# Patient Record
Sex: Female | Born: 1990
Health system: Southern US, Community
[De-identification: ages and names within clinical notes are randomized; demographics above are authoritative.]

## PROBLEM LIST (undated history)

## (undated) DIAGNOSIS — A749 Chlamydial infection, unspecified: Secondary | ICD-10-CM

## (undated) DIAGNOSIS — J45909 Unspecified asthma, uncomplicated: Secondary | ICD-10-CM

## (undated) DIAGNOSIS — E669 Obesity, unspecified: Secondary | ICD-10-CM

## (undated) DIAGNOSIS — H02409 Unspecified ptosis of unspecified eyelid: Secondary | ICD-10-CM

## (undated) DIAGNOSIS — R51 Headache: Secondary | ICD-10-CM

## (undated) HISTORY — PX: INCISION AND DRAINAGE OF WOUND: SHX1803

## (undated) HISTORY — PX: APPENDECTOMY: SHX54

---
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
## 2011-02-05 ENCOUNTER — Emergency Department (HOSPITAL_COMMUNITY)
Admission: EM | Admit: 2011-02-05 | Discharge: 2011-02-06 | Disposition: A | Discharge status: Home / Self Care | Payer: Medicaid Other | Attending: Emergency Medicine | Admitting: Emergency Medicine

## 2011-02-05 DIAGNOSIS — R109 Unspecified abdominal pain: Secondary | ICD-10-CM | POA: Insufficient documentation

## 2011-02-05 DIAGNOSIS — R110 Nausea: Secondary | ICD-10-CM | POA: Insufficient documentation

## 2011-02-05 DIAGNOSIS — N390 Urinary tract infection, site not specified: Secondary | ICD-10-CM | POA: Insufficient documentation

## 2011-02-05 DIAGNOSIS — N898 Other specified noninflammatory disorders of vagina: Secondary | ICD-10-CM | POA: Insufficient documentation

## 2011-02-05 LAB — DIFFERENTIAL
Basophils Absolute: 0 10*3/uL (ref 0.0–0.1)
Eosinophils Absolute: 0.1 10*3/uL (ref 0.0–0.7)
Lymphocytes Relative: 17 % (ref 12–46)
Lymphs Abs: 1.5 10*3/uL (ref 0.7–4.0)
Monocytes Absolute: 0.9 10*3/uL (ref 0.1–1.0)
Monocytes Relative: 10 % (ref 3–12)
Neutrophils Relative %: 72 % (ref 43–77)

## 2011-02-05 LAB — URINALYSIS, ROUTINE W REFLEX MICROSCOPIC
Nitrite: NEGATIVE
pH: 6.5 (ref 5.0–8.0)

## 2011-02-05 LAB — CBC
HCT: 38.3 % (ref 36.0–46.0)
Hemoglobin: 12.9 g/dL (ref 12.0–15.0)
MCV: 77.2 fL — ABNORMAL LOW (ref 78.0–100.0)
Platelets: 234 10*3/uL (ref 150–400)
RBC: 4.96 MIL/uL (ref 3.87–5.11)
WBC: 8.9 10*3/uL (ref 4.0–10.5)

## 2011-02-05 LAB — BASIC METABOLIC PANEL
BUN: 10 mg/dL (ref 6–23)
CO2: 27 mEq/L (ref 19–32)
Calcium: 9.5 mg/dL (ref 8.4–10.5)
Creatinine, Ser: 0.68 mg/dL (ref 0.50–1.10)
Glucose, Bld: 93 mg/dL (ref 70–99)
Sodium: 134 mEq/L — ABNORMAL LOW (ref 135–145)

## 2011-02-05 LAB — URINE MICROSCOPIC-ADD ON

## 2011-02-06 LAB — WET PREP, GENITAL
Trich, Wet Prep: NONE SEEN
Yeast Wet Prep HPF POC: NONE SEEN

## 2011-02-09 LAB — GC/CHLAMYDIA PROBE AMP, GENITAL
Chlamydia, DNA Probe: POSITIVE — AB
GC Probe Amp, Genital: NEGATIVE

## 2011-09-02 ENCOUNTER — Emergency Department (HOSPITAL_COMMUNITY)
Admission: EM | Admit: 2011-09-02 | Discharge: 2011-09-02 | Discharge status: Absent without leave | Payer: Medicaid Other | Source: Home / Self Care

## 2011-12-19 ENCOUNTER — Encounter (HOSPITAL_COMMUNITY): Payer: Self-pay | Admitting: *Deleted

## 2011-12-19 ENCOUNTER — Emergency Department (HOSPITAL_COMMUNITY)
Admission: EM | Admit: 2011-12-19 | Discharge: 2011-12-19 | Disposition: A | Discharge status: Home / Self Care | Payer: Medicaid Other | Attending: Emergency Medicine | Admitting: Emergency Medicine

## 2011-12-19 DIAGNOSIS — H6690 Otitis media, unspecified, unspecified ear: Secondary | ICD-10-CM | POA: Insufficient documentation

## 2011-12-19 MED ORDER — OFLOXACIN 0.3 % OT SOLN
5.0000 [drp] | Freq: Every day | OTIC | Status: DC
  Filled 2011-12-19: qty 5

## 2011-12-19 MED ORDER — HYDROCODONE-ACETAMINOPHEN 5-325 MG PO TABS: ORAL_TABLET | ORAL | Status: AC

## 2011-12-19 MED ORDER — CEFDINIR 300 MG PO CAPS: 300.0000 mg | ORAL_CAPSULE | Freq: Two times a day (BID) | ORAL | Status: AC

## 2011-12-19 MED ORDER — IBUPROFEN 800 MG PO TABS
800.0000 mg | ORAL_TABLET | Freq: Once | ORAL | Status: AC
  Administered 2011-12-19: 800 mg via ORAL
  Filled 2011-12-19: qty 1

## 2011-12-19 NOTE — ED Notes (Signed)
Reports being diagnosed with left ear infection 4 days ago. Having bleeding from ear, headache, chills.

## 2011-12-19 NOTE — ED Provider Notes (Signed)
History     CSN: [id number]  Arrival date & time 12/19/11  0901   First MD Initiated Contact with Patient 12/19/11 [phone]      Chief Complaint  Patient presents with  . Headache  . Otalgia    (Consider location/radiation/quality/duration/timing/severity/associated sxs/prior treatment) HPI Comments: Patient with history of chronic ear infections, approximately 4 per year, presents today with a left ear infection for the past one week. Patient states she was seen for this and started on Augmentin approximately 4 days ago. Patient continues to take this however she continues to have drainage from the ear that is clear as well as bloody. Patient states that she's never seen an ENT doctor. Patient has a headache. She has had difficulty with hearing out of left ear for 'a long time' but it is worse with this infection. This morning she woke up with chills and shaking but she denies fever. Multiple previous courses of amox/augmentin. Nothing makes symptoms better or worse. Onset was acute. Course is constant.   Patient is a 20 y.o. female presenting with ear pain. The history is provided by the patient.  Otalgia This is a recurrent problem. The current episode started more than 1 week ago. There is pain in the left ear. The problem occurs constantly. The problem has not changed since onset.There has been no fever. The pain is mild. Associated symptoms include ear discharge and headaches. Pertinent negatives include no rhinorrhea, no sore throat, no abdominal pain, no diarrhea, no vomiting, no neck pain, no cough and no rash. Her past medical history is significant for chronic ear infection.    History reviewed. No pertinent past medical history.  History reviewed. No pertinent past surgical history.  History reviewed. No pertinent family history.  History  Substance Use Topics  . Smoking status: Not on file  . Smokeless tobacco: Not on file  . Alcohol Use: No    OB History    Grav Para  Term Preterm Abortions TAB SAB Ect Mult Living                  Review of Systems  Constitutional: Negative for fever.  HENT: Positive for ear pain and ear discharge. Negative for sore throat, rhinorrhea and neck pain.   Eyes: Negative for redness.  Respiratory: Negative for cough.   Cardiovascular: Negative for chest pain.  Gastrointestinal: Negative for nausea, vomiting, abdominal pain and diarrhea.  Genitourinary: Negative for dysuria.  Musculoskeletal: Negative for myalgias.  Skin: Negative for rash.  Neurological: Positive for headaches.    Allergies  Review of patient's allergies indicates no known allergies.  Home Medications   Current Outpatient Rx  Name Route Sig Dispense Refill  . AMOXICILLIN-POT CLAVULANATE 875-125 MG PO TABS Oral Take 1 tablet by mouth 2 (two) times daily.      BP 127/74  Pulse 74  Temp 98.6 F (37 C) (Oral)  Resp 16  SpO2 100%  Physical Exam  Nursing note and vitals reviewed. Constitutional: She appears well-developed and well-nourished.  HENT:  Head: Normocephalic and atraumatic.  Right Ear: Ear canal normal. No drainage. No mastoid tenderness. Tympanic membrane is not perforated and not erythematous. No decreased hearing is noted.  Left Ear: There is drainage. No swelling. No mastoid tenderness. Tympanic membrane is perforated and erythematous. Decreased hearing is noted.  Nose: Nose normal.  Mouth/Throat: Uvula is midline, oropharynx is clear and moist and mucous membranes are normal. Mucous membranes are not dry.  Eyes: Conjunctivae are normal. Right eye  exhibits no discharge. Left eye exhibits no discharge.  Neck: Normal range of motion. Neck supple.  Cardiovascular: Normal rate, regular rhythm and normal heart sounds.   Pulmonary/Chest: Effort normal and breath sounds normal. No respiratory distress.  Abdominal: Soft. There is no tenderness.  Neurological: She is alert.  Skin: Skin is warm and dry.  Psychiatric: She has a normal  mood and affect.    ED Course  Procedures (including critical care time)  Labs Reviewed - No data to display No results found.   1. Acute otitis media with perforation     9:51 AM Patient seen and examined. Medications ordered. Will irrigate ear.   Vital signs reviewed and are as follows: Filed Vitals:   12/19/11 0906  BP: 127/74  Pulse: 74  Temp: 98.6 F (37 C)  Resp: 16   Ear irrigated by nurse. On-exam, there is irritation of canal, perforation of ear drum, possibly chronic. Will treat. Stressed need for ENT follow-up. Patient admits she has had much trouble with classes and missing classes in college due to frequent infections and hearing difficulties.   Urged to return with worsening pain, fever, redness or swelling of/around ear. Patient verbalizes understanding and agrees.    MDM  Patient with frequent otitis -- now with increasing pain, drainage. Exam shows perforation and decreased hearing. Otofloxacin given in ED due to perforation. Given frequent use of amox/augmentin -- will switch antibiotics to omnicef. ENT referral given and family urged to follow-up given severity and duration of symptoms.         [place], [place] 12/19/11 [phone]

## 2011-12-19 NOTE — ED Notes (Signed)
MD at bedside. 

## 2011-12-19 NOTE — ED Provider Notes (Signed)
Medical screening examination/treatment/procedure(s) were performed by non-physician practitioner and as supervising physician I was immediately available for consultation/collaboration.  Makenize Messman L Mattison Stuckey, MD 12/19/11 1646 

## 2012-01-04 DIAGNOSIS — H02409 Unspecified ptosis of unspecified eyelid: Secondary | ICD-10-CM

## 2012-01-04 HISTORY — DX: Unspecified ptosis of unspecified eyelid: H02.409

## 2012-01-08 ENCOUNTER — Encounter (HOSPITAL_COMMUNITY): Payer: Self-pay | Admitting: Emergency Medicine

## 2012-01-08 ENCOUNTER — Encounter (HOSPITAL_COMMUNITY): Admission: EM | Disposition: A | Discharge status: Home / Self Care | Payer: Self-pay | Source: Home / Self Care

## 2012-01-08 ENCOUNTER — Inpatient Hospital Stay (HOSPITAL_COMMUNITY): Payer: Medicaid Other | Admitting: Anesthesiology

## 2012-01-08 ENCOUNTER — Encounter (HOSPITAL_COMMUNITY): Payer: Self-pay | Admitting: Anesthesiology

## 2012-01-08 ENCOUNTER — Emergency Department (HOSPITAL_COMMUNITY): Payer: Medicaid Other

## 2012-01-08 ENCOUNTER — Inpatient Hospital Stay (HOSPITAL_COMMUNITY)
Admission: EM | Admit: 2012-01-08 | Discharge: 2012-01-09 | DRG: 343 | Disposition: A | Discharge status: Home / Self Care | Payer: Medicaid Other | Attending: General Surgery | Admitting: General Surgery

## 2012-01-08 DIAGNOSIS — K3580 Unspecified acute appendicitis: Secondary | ICD-10-CM

## 2012-01-08 DIAGNOSIS — J45909 Unspecified asthma, uncomplicated: Secondary | ICD-10-CM | POA: Diagnosis present

## 2012-01-08 DIAGNOSIS — K37 Unspecified appendicitis: Principal | ICD-10-CM | POA: Diagnosis present

## 2012-01-08 DIAGNOSIS — R51 Headache: Secondary | ICD-10-CM

## 2012-01-08 HISTORY — DX: Headache: R51

## 2012-01-08 HISTORY — PX: LAPAROSCOPIC APPENDECTOMY: SHX408

## 2012-01-08 HISTORY — PX: APPENDECTOMY: SHX54

## 2012-01-08 HISTORY — DX: Unspecified asthma, uncomplicated: J45.909

## 2012-01-08 HISTORY — DX: Unspecified ptosis of unspecified eyelid: H02.409

## 2012-01-08 LAB — URINALYSIS, ROUTINE W REFLEX MICROSCOPIC
Protein, ur: NEGATIVE mg/dL
Specific Gravity, Urine: 1.021 (ref 1.005–1.030)

## 2012-01-08 LAB — BASIC METABOLIC PANEL
Chloride: 106 mEq/L (ref 96–112)
Glucose, Bld: 97 mg/dL (ref 70–99)
Sodium: 142 mEq/L (ref 135–145)

## 2012-01-08 LAB — CBC WITH DIFFERENTIAL/PLATELET
Basophils Absolute: 0 10*3/uL (ref 0.0–0.1)
Basophils Relative: 0 % (ref 0–1)
Eosinophils Absolute: 0.1 10*3/uL (ref 0.0–0.7)
HCT: 37.5 % (ref 36.0–46.0)
Lymphs Abs: 3 10*3/uL (ref 0.7–4.0)
MCHC: 33.1 g/dL (ref 30.0–36.0)
MCV: 76.8 fL — ABNORMAL LOW (ref 78.0–100.0)
Neutro Abs: 8.9 10*3/uL — ABNORMAL HIGH (ref 1.7–7.7)
Neutrophils Relative %: 70 % (ref 43–77)
RBC: 4.88 MIL/uL (ref 3.87–5.11)
WBC: 12.8 10*3/uL — ABNORMAL HIGH (ref 4.0–10.5)

## 2012-01-08 LAB — HEPATIC FUNCTION PANEL
AST: 23 U/L (ref 0–37)
Albumin: 4 g/dL (ref 3.5–5.2)
Alkaline Phosphatase: 86 U/L (ref 39–117)
Indirect Bilirubin: 0.2 mg/dL — ABNORMAL LOW (ref 0.3–0.9)
Total Bilirubin: 0.3 mg/dL (ref 0.3–1.2)
Total Protein: 7.5 g/dL (ref 6.0–8.3)

## 2012-01-08 LAB — URINE MICROSCOPIC-ADD ON

## 2012-01-08 LAB — POCT PREGNANCY, URINE: Preg Test, Ur: NEGATIVE

## 2012-01-08 SURGERY — APPENDECTOMY, LAPAROSCOPIC
Anesthesia: General | Site: Abdomen | Wound class: Contaminated

## 2012-01-08 MED ORDER — BUPIVACAINE-EPINEPHRINE PF 0.25-1:200000 % IJ SOLN
INTRAMUSCULAR | Status: AC
  Filled 2012-01-08: qty 30

## 2012-01-08 MED ORDER — MEPERIDINE HCL 25 MG/ML IJ SOLN: 6.2500 mg | INTRAMUSCULAR | Status: DC | PRN

## 2012-01-08 MED ORDER — HYDROMORPHONE HCL PF 1 MG/ML IJ SOLN
INTRAMUSCULAR | Status: AC
  Filled 2012-01-08: qty 1

## 2012-01-08 MED ORDER — NEOSTIGMINE METHYLSULFATE 1 MG/ML IJ SOLN
INTRAMUSCULAR | Status: DC | PRN
  Administered 2012-01-08: 4 mg via INTRAVENOUS
  Administered 2012-01-08: 1 mg via INTRAVENOUS

## 2012-01-08 MED ORDER — LACTATED RINGERS IV SOLN
INTRAVENOUS | Status: DC | PRN
  Administered 2012-01-08 (×2): via INTRAVENOUS

## 2012-01-08 MED ORDER — 0.9 % SODIUM CHLORIDE (POUR BTL) OPTIME
TOPICAL | Status: DC | PRN
  Administered 2012-01-08: 1000 mL

## 2012-01-08 MED ORDER — SODIUM CHLORIDE 0.9 % IR SOLN
Status: DC | PRN
  Administered 2012-01-08: 1000 mL

## 2012-01-08 MED ORDER — FENTANYL CITRATE 0.05 MG/ML IJ SOLN
INTRAMUSCULAR | Status: DC | PRN
  Administered 2012-01-08: 50 ug via INTRAVENOUS
  Administered 2012-01-08 (×3): 100 ug via INTRAVENOUS

## 2012-01-08 MED ORDER — SUCCINYLCHOLINE CHLORIDE 20 MG/ML IJ SOLN
INTRAMUSCULAR | Status: DC | PRN
  Administered 2012-01-08: 120 mg via INTRAVENOUS

## 2012-01-08 MED ORDER — MIDAZOLAM HCL 5 MG/5ML IJ SOLN
INTRAMUSCULAR | Status: DC | PRN
  Administered 2012-01-08 (×2): 1 mg via INTRAVENOUS

## 2012-01-08 MED ORDER — MORPHINE SULFATE 4 MG/ML IJ SOLN: 4.0000 mg | INTRAMUSCULAR | Status: DC | PRN

## 2012-01-08 MED ORDER — DIPHENHYDRAMINE HCL 12.5 MG/5ML PO ELIX
12.5000 mg | ORAL_SOLUTION | Freq: Four times a day (QID) | ORAL | Status: DC | PRN
  Filled 2012-01-08: qty 10

## 2012-01-08 MED ORDER — HYDROCODONE-ACETAMINOPHEN 5-325 MG PO TABS
1.0000 | ORAL_TABLET | ORAL | Status: DC | PRN
  Administered 2012-01-09: 2 via ORAL
  Filled 2012-01-08: qty 2

## 2012-01-08 MED ORDER — PIPERACILLIN-TAZOBACTAM 3.375 G IVPB
3.3750 g | Freq: Once | INTRAVENOUS | Status: AC
  Administered 2012-01-08: 3.375 g via INTRAVENOUS
  Filled 2012-01-08: qty 50

## 2012-01-08 MED ORDER — ACETAMINOPHEN 650 MG RE SUPP: 650.0000 mg | Freq: Four times a day (QID) | RECTAL | Status: DC | PRN

## 2012-01-08 MED ORDER — DIPHENHYDRAMINE HCL 50 MG/ML IJ SOLN
12.5000 mg | Freq: Four times a day (QID) | INTRAMUSCULAR | Status: DC | PRN
  Administered 2012-01-08 – 2012-01-09 (×2): 12.5 mg via INTRAVENOUS
  Filled 2012-01-08 (×2): qty 1

## 2012-01-08 MED ORDER — PROMETHAZINE HCL 25 MG/ML IJ SOLN
6.2500 mg | INTRAMUSCULAR | Status: DC | PRN
  Filled 2012-01-08: qty 1

## 2012-01-08 MED ORDER — HYDROMORPHONE HCL PF 1 MG/ML IJ SOLN
1.0000 mg | INTRAMUSCULAR | Status: DC | PRN
  Administered 2012-01-08 – 2012-01-09 (×2): 1 mg via INTRAVENOUS
  Filled 2012-01-08 (×2): qty 1

## 2012-01-08 MED ORDER — ROCURONIUM BROMIDE 100 MG/10ML IV SOLN
INTRAVENOUS | Status: DC | PRN
  Administered 2012-01-08: 20 mg via INTRAVENOUS

## 2012-01-08 MED ORDER — WHITE PETROLATUM GEL
Status: AC
  Filled 2012-01-08: qty 5

## 2012-01-08 MED ORDER — ACETAMINOPHEN 325 MG PO TABS: 650.0000 mg | ORAL_TABLET | Freq: Four times a day (QID) | ORAL | Status: DC | PRN

## 2012-01-08 MED ORDER — ONDANSETRON HCL 4 MG/2ML IJ SOLN: 4.0000 mg | Freq: Four times a day (QID) | INTRAMUSCULAR | Status: DC | PRN

## 2012-01-08 MED ORDER — LIDOCAINE HCL (CARDIAC) 20 MG/ML IV SOLN
INTRAVENOUS | Status: DC | PRN
  Administered 2012-01-08: 80 mg via INTRAVENOUS

## 2012-01-08 MED ORDER — GLYCOPYRROLATE 0.2 MG/ML IJ SOLN
INTRAMUSCULAR | Status: DC | PRN
  Administered 2012-01-08: .1 mg via INTRAVENOUS
  Administered 2012-01-08: .7 mg via INTRAVENOUS

## 2012-01-08 MED ORDER — SODIUM CHLORIDE 0.9 % IV SOLN
INTRAVENOUS | Status: DC
  Administered 2012-01-08 – 2012-01-09 (×2): via INTRAVENOUS

## 2012-01-08 MED ORDER — IOHEXOL 300 MG/ML  SOLN
100.0000 mL | Freq: Once | INTRAMUSCULAR | Status: AC | PRN
  Administered 2012-01-08: 100 mL via INTRAVENOUS

## 2012-01-08 MED ORDER — BUPIVACAINE-EPINEPHRINE 0.25% -1:200000 IJ SOLN
INTRAMUSCULAR | Status: DC | PRN
  Administered 2012-01-08: 26 mL

## 2012-01-08 MED ORDER — HYDROMORPHONE HCL PF 1 MG/ML IJ SOLN
0.2500 mg | INTRAMUSCULAR | Status: DC | PRN
  Administered 2012-01-08 (×4): 0.5 mg via INTRAVENOUS

## 2012-01-08 MED ORDER — DOCUSATE SODIUM 100 MG PO CAPS
100.0000 mg | ORAL_CAPSULE | Freq: Two times a day (BID) | ORAL | Status: DC
  Administered 2012-01-08 – 2012-01-09 (×2): 100 mg via ORAL
  Filled 2012-01-08 (×2): qty 1

## 2012-01-08 MED ORDER — LACTATED RINGERS IV SOLN
INTRAVENOUS | Status: DC
  Administered 2012-01-08: 11:00:00 via INTRAVENOUS

## 2012-01-08 MED ORDER — HYDROCODONE-ACETAMINOPHEN 5-325 MG PO TABS
1.0000 | ORAL_TABLET | ORAL | Status: DC | PRN
  Administered 2012-01-09 (×2): 2 via ORAL
  Filled 2012-01-08 (×2): qty 2

## 2012-01-08 MED ORDER — PANTOPRAZOLE SODIUM 40 MG IV SOLR
40.0000 mg | Freq: Every day | INTRAVENOUS | Status: DC
  Administered 2012-01-08: 40 mg via INTRAVENOUS
  Filled 2012-01-08 (×2): qty 40

## 2012-01-08 MED ORDER — PROPOFOL 10 MG/ML IV EMUL
INTRAVENOUS | Status: DC | PRN
  Administered 2012-01-08: 170 mg via INTRAVENOUS

## 2012-01-08 MED ORDER — IOHEXOL 300 MG/ML  SOLN
20.0000 mL | INTRAMUSCULAR | Status: DC
  Administered 2012-01-08: 20 mL via ORAL

## 2012-01-08 MED ORDER — ONDANSETRON HCL 4 MG/2ML IJ SOLN
INTRAMUSCULAR | Status: DC | PRN
  Administered 2012-01-08: 4 mg via INTRAVENOUS

## 2012-01-08 SURGICAL SUPPLY — 45 items
APPLIER CLIP ROT 10 11.4 M/L (STAPLE)
BLADE SURG ROTATE 9660 (MISCELLANEOUS) IMPLANT
CANISTER SUCTION 2500CC (MISCELLANEOUS) ×2 IMPLANT
CHLORAPREP W/TINT 26ML (MISCELLANEOUS) ×2 IMPLANT
CLIP APPLIE ROT 10 11.4 M/L (STAPLE) IMPLANT
CLOTH BEACON ORANGE TIMEOUT ST (SAFETY) ×2 IMPLANT
COVER SURGICAL LIGHT HANDLE (MISCELLANEOUS) ×2 IMPLANT
CUTTER FLEX LINEAR 45M (STAPLE) ×2 IMPLANT
DECANTER SPIKE VIAL GLASS SM (MISCELLANEOUS) IMPLANT
DERMABOND ADHESIVE PROPEN (GAUZE/BANDAGES/DRESSINGS) ×1
DERMABOND ADVANCED (GAUZE/BANDAGES/DRESSINGS) ×1
DERMABOND ADVANCED .7 DNX12 (GAUZE/BANDAGES/DRESSINGS) ×1 IMPLANT
DERMABOND ADVANCED .7 DNX6 (GAUZE/BANDAGES/DRESSINGS) ×1 IMPLANT
DRAPE UTILITY 15X26 W/TAPE STR (DRAPE) ×4 IMPLANT
ELECT REM PT RETURN 9FT ADLT (ELECTROSURGICAL) ×2
ELECTRODE REM PT RTRN 9FT ADLT (ELECTROSURGICAL) ×1 IMPLANT
ENDOLOOP SUT PDS II  0 18 (SUTURE)
ENDOLOOP SUT PDS II 0 18 (SUTURE) IMPLANT
GLOVE BIO SURGEON STRL SZ7.5 (GLOVE) ×2 IMPLANT
GLOVE BIOGEL PI IND STRL 6 (GLOVE) ×2 IMPLANT
GLOVE BIOGEL PI IND STRL 7.0 (GLOVE) ×2 IMPLANT
GLOVE BIOGEL PI INDICATOR 6 (GLOVE) ×2
GLOVE BIOGEL PI INDICATOR 7.0 (GLOVE) ×2
GLOVE ECLIPSE 6.5 STRL STRAW (GLOVE) ×2 IMPLANT
GLOVE SURG SS PI 7.0 STRL IVOR (GLOVE) ×2 IMPLANT
GOWN STRL NON-REIN LRG LVL3 (GOWN DISPOSABLE) ×6 IMPLANT
KIT BASIN OR (CUSTOM PROCEDURE TRAY) ×2 IMPLANT
KIT ROOM TURNOVER OR (KITS) ×2 IMPLANT
NS IRRIG 1000ML POUR BTL (IV SOLUTION) ×2 IMPLANT
PAD ARMBOARD 7.5X6 YLW CONV (MISCELLANEOUS) ×4 IMPLANT
POUCH SPECIMEN RETRIEVAL 10MM (ENDOMECHANICALS) ×2 IMPLANT
RELOAD STAPLE TA45 3.5 REG BLU (ENDOMECHANICALS) ×2 IMPLANT
SCALPEL HARMONIC ACE (MISCELLANEOUS) ×2 IMPLANT
SET IRRIG TUBING LAPAROSCOPIC (IRRIGATION / IRRIGATOR) ×2 IMPLANT
SLEEVE ENDOPATH XCEL 5M (ENDOMECHANICALS) ×2 IMPLANT
SPECIMEN JAR SMALL (MISCELLANEOUS) ×2 IMPLANT
SUT MNCRL AB 4-0 PS2 18 (SUTURE) ×2 IMPLANT
TOWEL OR 17X24 6PK STRL BLUE (TOWEL DISPOSABLE) ×2 IMPLANT
TOWEL OR 17X26 10 PK STRL BLUE (TOWEL DISPOSABLE) ×2 IMPLANT
TRAY FOLEY CATH 14FR (SET/KITS/TRAYS/PACK) ×2 IMPLANT
TRAY LAPAROSCOPIC (CUSTOM PROCEDURE TRAY) ×2 IMPLANT
TROCAR XCEL BLUNT TIP 100MML (ENDOMECHANICALS) ×2 IMPLANT
TROCAR XCEL NON-BLD 11X100MML (ENDOMECHANICALS) ×2 IMPLANT
TROCAR XCEL NON-BLD 5MMX100MML (ENDOMECHANICALS) ×2 IMPLANT
WATER STERILE IRR 1000ML POUR (IV SOLUTION) IMPLANT

## 2012-01-08 NOTE — Anesthesia Postprocedure Evaluation (Signed)
  Anesthesia Post-op Note  Patient: Karen Soto  Procedure(s) Performed: Procedure(s) (LRB): APPENDECTOMY LAPAROSCOPIC (N/A)  Patient Location: PACU  Anesthesia Type: General  Level of Consciousness: awake  Airway and Oxygen Therapy: Patient Spontanous Breathing  Post-op Pain: mild  Post-op Assessment: Post-op Vital signs reviewed  Post-op Vital Signs: stable  Complications: No apparent anesthesia complications

## 2012-01-08 NOTE — ED Notes (Signed)
PT. REPORTS RIGHT ABDOMINAL PAIN WITH NAUSEA ONSET 6 PM LAST NIGHT , DENIES VOMITTING OR DIARRHEA , NO FEVER OR CHILLS , DENIES DYSURIA OR VAGINAL DISCHARGE.

## 2012-01-08 NOTE — ED Notes (Signed)
OR IS READY FOR PT. REPORT CALLED TO [id number] . REPORT TO TOM

## 2012-01-08 NOTE — Op Note (Signed)
01/08/2012  1:30 PM  PATIENT:  Karen Soto  21 y.o. female  PRE-OPERATIVE DIAGNOSIS:  Acute Appendicitis  POST-OPERATIVE DIAGNOSIS:  Acute Appendicitis  PROCEDURE:  Procedure(s) (LRB): APPENDECTOMY LAPAROSCOPIC (N/A)  SURGEON:  Surgeon(s) and Role:    * Robyne Askew, MD - Primary  PHYSICIAN ASSISTANT:   ASSISTANTS: none   ANESTHESIA:   general  EBL:     BLOOD ADMINISTERED:none  DRAINS: none   LOCAL MEDICATIONS USED:  MARCAINE     SPECIMEN:  Source of Specimen:  appendix  DISPOSITION OF SPECIMEN:  PATHOLOGY  COUNTS:  YES  TOURNIQUET:  * No tourniquets in log *  DICTATION: .Dragon Dictation After informed consent was obtained patient was brought to the operating room placed in the supine position on the operating room table. After adequate induction of general anesthesia the patient's abdomen was prepped with ChloraPrep, allowed to dry, and draped in usual sterile manner. The area below the umbilicus was infiltrated with quarter percent Marcaine. A small incision was made with a 15 blade knife. This incision was carried down through the subcutaneous tissue bluntly with a hemostat and [employer]-Nav[employer] retractors until the linea alba was identified. The linea alba was incised with a 15 blade knife. Each side was grasped Coker clamps and elevated anteriorly. The preperitoneal space was probed bluntly with a hemostat until the peritoneum was opened and access was gained to the abdominal cavity. A 0 Vicryl purse string stitch was placed in the fascia surrounding the opening. A Hassan cannula was placed through the opening and anchored in place with the previously placed Vicryl purse string stitch. The laparoscope was placed through the [facility] cannula. The abdomen was insufflated with carbon dioxide without difficulty. nnext the suprapubic area was infiltrated with quarter percent Marcaine. A small incision was made with a 15 blade knife. A 5 mm port was placed bluntly through this  incision into the abdominal cavity. A site was then chosen between the 2 port for placement of a 5 mm port. The area was infiltrated with quarter percent Marcaine. A small stab incision was made with a 15 blade knife. A 5 mm port was placed bluntly through this incision and the abdominal cavity under direct vision. The laparoscope was then moved to the suprapubic port. Using a Glassman grasper and harmonic scalpel the right lower quadrant was inspected. The appendix was readily identified. The appendix was elevated anteriorly and the mesoappendix was taken down sharply with the harmonic scalpel. Once the base of the appendix where it joined the cecum was identified and cleared of any tissue then a laparoscopic GIA blue load 6 row stapler was placed through the [facility] cannula. The stapler was placed across the base of the appendix clamped and fired thereby dividing the base of the appendix between staple lines. A laparoscopic bag was then inserted through the [facility] cannula. The appendix was placed within the bag and the bag was sealed. The abdomen was then irrigated with copious amounts of saline until the effluent was clear. No other abnormalities were noted. The appendix and bag were removed with the [facility] cannula through the infraumbilical port without difficulty. The fascial defect was closed with the previously placed Vicryl pursestring stitch as well as with another interrupted 0 Vicryl figure-of-eight stitch. The rest of the ports were removed under direct vision and were found to be hemostatic. The gas was allowed to escape. The skin incisions were closed with interrupted 4-0 Monocryl subcuticular stitches. Dermabond dressings were applied. The patient tolerated  the procedure well. At the end of the case all needle sponge and instrument counts were correct. The patient was then awakened and taken to recovery in stable condition.   PLAN OF CARE: Admit for overnight observation  PATIENT DISPOSITION:  PACU  - hemodynamically stable.   Delay start of Pharmacological VTE agent (>24hrs) due to surgical blood loss or risk of bleeding: not applicable

## 2012-01-08 NOTE — Preoperative (Signed)
Beta Blockers 2  Reason not to administer Beta Blockers:Not Applicable 

## 2012-01-08 NOTE — ED Provider Notes (Addendum)
History     CSN: [id number]  Arrival date & time 01/08/12  0458   First MD Initiated Contact with Patient 01/08/12 [phone]      Chief Complaint  Patient presents with  . Abdominal Pain    (Consider location/radiation/quality/duration/timing/severity/associated sxs/prior treatment) Patient is a 21 y.o. female presenting with abdominal pain. The history is provided by the patient.  Abdominal Pain The primary symptoms of the illness include abdominal pain. The primary symptoms of the illness do not include fever, shortness of breath or dysuria.  Symptoms associated with the illness do not include chills, hematuria or back pain.  pt c/o right abd pain for past 3 days. Constant. Dull. Non radiating. Denies specific exacerbating or alleviating factors. No change w eating. Feels in both right upper and right mid abdomen. No pelvic pain. No vaginal discharge or bleeding. Unsure of last period, has been on depo shots. Denies hx same pain. No hx gallstones. No prior abd surgery. No fever or chills. Sl decreased appetite. No nvd. Denies constipation. No dysuria or urgency.      Past Medical History  Diagnosis Date  . Asthma     History reviewed. No pertinent past surgical history.  No family history on file.  History  Substance Use Topics  . Smoking status: Not on file  . Smokeless tobacco: Not on file  . Alcohol Use: No    OB History    Grav Para Term Preterm Abortions TAB SAB Ect Mult Living                  Review of Systems  Constitutional: Negative for fever and chills.  HENT: Negative for neck pain.   Eyes: Negative for redness.  Respiratory: Negative for cough and shortness of breath.   Cardiovascular: Negative for chest pain.  Gastrointestinal: Positive for abdominal pain.  Genitourinary: Negative for dysuria, hematuria and flank pain.  Musculoskeletal: Negative for back pain.  Skin: Negative for rash.  Neurological: Negative for headaches.  Hematological: Does not  bruise/bleed easily.  Psychiatric/Behavioral: Negative for confusion.    Allergies  Review of patient's allergies indicates no known allergies.  Home Medications  No current outpatient prescriptions on file.  BP 129/87  Pulse 100  Temp 98.1 F (36.7 C) (Oral)  Resp 18  SpO2 100%  Physical Exam  Nursing note and vitals reviewed. Constitutional: She appears well-developed and well-nourished. No distress.  HENT:  Mouth/Throat: Oropharynx is clear and moist.  Eyes: Conjunctivae are normal. No scleral icterus.  Neck: Neck supple. No tracheal deviation present.  Cardiovascular: Normal rate, regular rhythm, normal heart sounds and intact distal pulses.   Pulmonary/Chest: Effort normal and breath sounds normal. No respiratory distress.  Abdominal: Soft. Normal appearance and bowel sounds are normal. She exhibits no distension and no mass. There is tenderness. There is no rebound and no guarding.       Right abdominal tenderness. No rebound or guarding.   Genitourinary:       No cva tenderness  Musculoskeletal: She exhibits no edema.  Neurological: She is alert.  Skin: Skin is warm and dry. No rash noted.  Psychiatric: She has a normal mood and affect.    ED Course  Procedures (including critical care time)   Labs Reviewed  URINALYSIS, ROUTINE W REFLEX MICROSCOPIC  CBC WITH DIFFERENTIAL  BASIC METABOLIC PANEL  HEPATIC FUNCTION PANEL  LIPASE, BLOOD    Results for orders placed during the hospital encounter of 01/08/12  URINALYSIS, ROUTINE W REFLEX MICROSCOPIC  Component Value Range   Color, Urine YELLOW  YELLOW   APPearance CLOUDY (*) CLEAR   Specific Gravity, Urine 1.021  1.005 - 1.030   pH 6.5  5.0 - 8.0   Glucose, UA NEGATIVE  NEGATIVE mg/dL   Hgb urine dipstick MODERATE (*) NEGATIVE   Bilirubin Urine NEGATIVE  NEGATIVE   Ketones, ur NEGATIVE  NEGATIVE mg/dL   Protein, ur NEGATIVE  NEGATIVE mg/dL   Urobilinogen, UA 0.2  0.0 - 1.0 mg/dL   Nitrite NEGATIVE   NEGATIVE   Leukocytes, UA TRACE (*) NEGATIVE  CBC WITH DIFFERENTIAL      Component Value Range   WBC 12.8 (*) 4.0 - 10.5 K/uL   RBC 4.88  3.87 - 5.11 MIL/uL   Hemoglobin 12.4  12.0 - 15.0 g/dL   HCT 13.2  44.0 - 10.2 %   MCV 76.8 (*) 78.0 - 100.0 fL   MCH 25.4 (*) 26.0 - 34.0 pg   MCHC 33.1  30.0 - 36.0 g/dL   RDW 72.5  36.6 - 44.0 %   Platelets 246  150 - 400 K/uL   Neutrophils Relative 70  43 - 77 %   Neutro Abs 8.9 (*) 1.7 - 7.7 K/uL   Lymphocytes Relative 23  12 - 46 %   Lymphs Abs 3.0  0.7 - 4.0 K/uL   Monocytes Relative 6  3 - 12 %   Monocytes Absolute 0.8  0.1 - 1.0 K/uL   Eosinophils Relative 1  0 - 5 %   Eosinophils Absolute 0.1  0.0 - 0.7 K/uL   Basophils Relative 0  0 - 1 %   Basophils Absolute 0.0  0.0 - 0.1 K/uL  BASIC METABOLIC PANEL      Component Value Range   Sodium 142  135 - 145 mEq/L   Potassium 3.8  3.5 - 5.1 mEq/L   Chloride 106  96 - 112 mEq/L   CO2 25  19 - 32 mEq/L   Glucose, Bld 97  70 - 99 mg/dL   BUN 11  6 - 23 mg/dL   Creatinine, Ser 3.47  0.50 - 1.10 mg/dL   Calcium 9.5  8.4 - 42.5 mg/dL   GFR calc non Af Amer >90  >90 mL/min   GFR calc Af Amer >90  >90 mL/min  HEPATIC FUNCTION PANEL      Component Value Range   Total Protein 7.5  6.0 - 8.3 g/dL   Albumin 4.0  3.5 - 5.2 g/dL   AST 23  0 - 37 U/L   ALT 28  0 - 35 U/L   Alkaline Phosphatase 86  39 - 117 U/L   Total Bilirubin 0.3  0.3 - 1.2 mg/dL   Bilirubin, Direct 0.1  0.0 - 0.3 mg/dL   Indirect Bilirubin 0.2 (*) 0.3 - 0.9 mg/dL  LIPASE, BLOOD      Component Value Range   Lipase 27  11 - 59 U/L  POCT PREGNANCY, URINE      Component Value Range   Preg Test, Ur NEGATIVE  NEGATIVE  URINE MICROSCOPIC-ADD ON      Component Value Range   Squamous Epithelial / LPF FEW (*) RARE   WBC, UA 3-6  <3 WBC/hpf   RBC / HPF 0-2  <3 RBC/hpf   Bacteria, UA RARE  RARE   Urine-Other MUCOUS PRESENT         MDM  Iv ns. Labs.   Ct.   Signed out to morning edp to  check ct scan when resulted, and  dispo appropriately based on those results.         Suzi Roots, MD 01/08/12 [phone]  CT confirms appendicitis - discussed with radiologist. [facility] is consulted for admission.  Results for orders placed during the hospital encounter of 01/08/12  URINALYSIS, ROUTINE W REFLEX MICROSCOPIC      Component Value Range   Color, Urine YELLOW  YELLOW   APPearance CLOUDY (*) CLEAR   Specific Gravity, Urine 1.021  1.005 - 1.030   pH 6.5  5.0 - 8.0   Glucose, UA NEGATIVE  NEGATIVE mg/dL   Hgb urine dipstick MODERATE (*) NEGATIVE   Bilirubin Urine NEGATIVE  NEGATIVE   Ketones, ur NEGATIVE  NEGATIVE mg/dL   Protein, ur NEGATIVE  NEGATIVE mg/dL   Urobilinogen, UA 0.2  0.0 - 1.0 mg/dL   Nitrite NEGATIVE  NEGATIVE   Leukocytes, UA TRACE (*) NEGATIVE  CBC WITH DIFFERENTIAL      Component Value Range   WBC 12.8 (*) 4.0 - 10.5 K/uL   RBC 4.88  3.87 - 5.11 MIL/uL   Hemoglobin 12.4  12.0 - 15.0 g/dL   HCT 96.0  45.4 - 09.8 %   MCV 76.8 (*) 78.0 - 100.0 fL   MCH 25.4 (*) 26.0 - 34.0 pg   MCHC 33.1  30.0 - 36.0 g/dL   RDW 11.9  14.7 - 82.9 %   Platelets 246  150 - 400 K/uL   Neutrophils Relative 70  43 - 77 %   Neutro Abs 8.9 (*) 1.7 - 7.7 K/uL   Lymphocytes Relative 23  12 - 46 %   Lymphs Abs 3.0  0.7 - 4.0 K/uL   Monocytes Relative 6  3 - 12 %   Monocytes Absolute 0.8  0.1 - 1.0 K/uL   Eosinophils Relative 1  0 - 5 %   Eosinophils Absolute 0.1  0.0 - 0.7 K/uL   Basophils Relative 0  0 - 1 %   Basophils Absolute 0.0  0.0 - 0.1 K/uL  BASIC METABOLIC PANEL      Component Value Range   Sodium 142  135 - 145 mEq/L   Potassium 3.8  3.5 - 5.1 mEq/L   Chloride 106  96 - 112 mEq/L   CO2 25  19 - 32 mEq/L   Glucose, Bld 97  70 - 99 mg/dL   BUN 11  6 - 23 mg/dL   Creatinine, Ser 5.62  0.50 - 1.10 mg/dL   Calcium 9.5  8.4 - 13.0 mg/dL   GFR calc non Af Amer >90  >90 mL/min   GFR calc Af Amer >90  >90 mL/min  HEPATIC FUNCTION PANEL      Component Value Range   Total  Protein 7.5  6.0 - 8.3 g/dL   Albumin 4.0  3.5 - 5.2 g/dL   AST 23  0 - 37 U/L   ALT 28  0 - 35 U/L   Alkaline Phosphatase 86  39 - 117 U/L   Total Bilirubin 0.3  0.3 - 1.2 mg/dL   Bilirubin, Direct 0.1  0.0 - 0.3 mg/dL   Indirect Bilirubin 0.2 (*) 0.3 - 0.9 mg/dL  LIPASE, BLOOD      Component Value Range   Lipase 27  11 - 59 U/L  POCT PREGNANCY, URINE      Component Value Range   Preg Test, Ur NEGATIVE  NEGATIVE  URINE MICROSCOPIC-ADD ON      Component  Value Range   Squamous Epithelial / LPF FEW (*) RARE   WBC, UA 3-6  <3 WBC/hpf   RBC / HPF 0-2  <3 RBC/hpf   Bacteria, UA RARE  RARE   Urine-Other MUCOUS PRESENT     Ct Abdomen Pelvis W Contrast  01/08/2012  *RADIOLOGY REPORT*  Clinical Data: Right mid to lower quadrant abdominal pain.  Nausea and vomiting.  CT ABDOMEN AND PELVIS WITH CONTRAST  Technique:  Multidetector CT imaging of the abdomen and pelvis was performed following the standard protocol during bolus administration of intravenous contrast.  Contrast: OMNIPAQUE IOHEXOL 300 MG/ML  SOLN  Comparison: None.  Findings: The liver, spleen, pancreas, and adrenal glands appear unremarkable.  The gallbladder and biliary system appear unremarkable.  The kidneys appear unremarkable, as do the proximal ureters.  Abnormal stranding in the right lower quadrant is present, with thickening of the appendix to 1.1 cm compatible with acute appendicitis.  No extraluminal gas or periappendiceal abscess observed.  However, there is a small amount of free pelvic fluid in the cul-de-sac.  Urinary bladder appears normal. The uterus and adnexa appear unremarkable.  No pathologic pelvic adenopathy is identified.  IMPRESSION:  1.  Acute appendicitis without extraluminal gas or abscess.  There is a small amount of fluid in the cul-de-sac.  Original Report Authenticated By: Dellia Cloud, M.D.      Dione Booze, MD 01/08/12 [phone]

## 2012-01-08 NOTE — Consult Note (Signed)
Risks and benefits of surgery discussed with pt including some of the technical aspects and she understands and wishes to proceed. 

## 2012-01-08 NOTE — ED Notes (Signed)
Family at bedside. 

## 2012-01-08 NOTE — ED Notes (Signed)
SPOKE WITH OR. THEY ADVISE WILL BE LESS THAN AN HOUR BEFORE THEY CALL FOR PT

## 2012-01-08 NOTE — Anesthesia Preprocedure Evaluation (Signed)
Anesthesia Evaluation  Patient identified by MRN, date of birth, ID band Patient awake    Airway Mallampati: I  Neck ROM: full    Dental   Pulmonary asthma ,  breath sounds clear to auscultation        Cardiovascular Rhythm:regular Rate:Normal     Neuro/Psych    GI/Hepatic Appendicictis   Endo/Other    Renal/GU      Musculoskeletal   Abdominal   Peds  Hematology negative hematology ROS (+)   Anesthesia Other Findings   Reproductive/Obstetrics                           Anesthesia Physical Anesthesia Plan  ASA: I and Emergent  Anesthesia Plan: General ETT   Post-op Pain Management:    Induction:   Airway Management Planned:   Additional Equipment:   Intra-op Plan:   Post-operative Plan:   Informed Consent: I have reviewed the patients History and Physical, chart, labs and discussed the procedure including the risks, benefits and alternatives for the proposed anesthesia with the patient or authorized representative who has indicated his/her understanding and acceptance.   Dental Advisory Given  Plan Discussed with: Anesthesiologist and Surgeon  Anesthesia Plan Comments:         Anesthesia Quick Evaluation

## 2012-01-08 NOTE — Consult Note (Signed)
Reason for Consult: Abdominal pain probable acute appendicitis  Referring Physician:David Lyndy Russman is an 21 y.o. female.  HPI: with c/o right abd pain for past 3 days. Constant. Dull. Non radiating. Denies specific exacerbating or alleviating factors. No change with eating. Feels in both RLQ and umbilical region . No pelvic pain. No vaginal discharge or bleeding. Unsure of last period, has been on depo shots. Denies hx same pain. No hx gallstones. No prior abd surgery. No fever or chills. Sl decreased appetite. No nvd. Denies constipation. No dysuria or urgency. Last BM was yesterday "normal" Last po intake was yesterday morning. CT of abdomen:  Acute appendicitis without extraluminal gas or abscess. There  is a small amount of fluid in the cul-de-sac.      Past Medical History  Diagnosis Date  . Asthma     History reviewed. No pertinent past surgical history.  No family history on file.  Social History:  does not have a smoking history on file. She does not have any smokeless tobacco history on file. She reports that she does not drink alcohol or use illicit drugs.  Allergies: No Known Allergies  Medications: I have reviewed the patient's current medications.  Results for orders placed during the hospital encounter of 01/08/12 (from the past 48 hour(s))  CBC WITH DIFFERENTIAL     Status: Abnormal   Collection Time   01/08/12  5:05 AM      Component Value Range Comment   WBC 12.8 (*) 4.0 - 10.5 K/uL    RBC 4.88  3.87 - 5.11 MIL/uL    Hemoglobin 12.4  12.0 - 15.0 g/dL    HCT 08.6  57.8 - 46.9 %    MCV 76.8 (*) 78.0 - 100.0 fL    MCH 25.4 (*) 26.0 - 34.0 pg    MCHC 33.1  30.0 - 36.0 g/dL    RDW 62.9  52.8 - 41.3 %    Platelets 246  150 - 400 K/uL    Neutrophils Relative 70  43 - 77 %    Neutro Abs 8.9 (*) 1.7 - 7.7 K/uL    Lymphocytes Relative 23  12 - 46 %    Lymphs Abs 3.0  0.7 - 4.0 K/uL    Monocytes Relative 6  3 - 12 %    Monocytes Absolute 0.8  0.1 -  1.0 K/uL    Eosinophils Relative 1  0 - 5 %    Eosinophils Absolute 0.1  0.0 - 0.7 K/uL    Basophils Relative 0  0 - 1 %    Basophils Absolute 0.0  0.0 - 0.1 K/uL   BASIC METABOLIC PANEL     Status: Normal   Collection Time   01/08/12  5:05 AM      Component Value Range Comment   Sodium 142  135 - 145 mEq/L    Potassium 3.8  3.5 - 5.1 mEq/L    Chloride 106  96 - 112 mEq/L    CO2 25  19 - 32 mEq/L    Glucose, Bld 97  70 - 99 mg/dL    BUN 11  6 - 23 mg/dL    Creatinine, Ser 2.44  0.50 - 1.10 mg/dL    Calcium 9.5  8.4 - 01.0 mg/dL    GFR calc non Af Amer >90  >90 mL/min    GFR calc Af Amer >90  >90 mL/min   HEPATIC FUNCTION PANEL     Status: Abnormal  Collection Time   01/08/12  5:29 AM      Component Value Range Comment   Total Protein 7.5  6.0 - 8.3 g/dL    Albumin 4.0  3.5 - 5.2 g/dL    AST 23  0 - 37 U/L    ALT 28  0 - 35 U/L    Alkaline Phosphatase 86  39 - 117 U/L    Total Bilirubin 0.3  0.3 - 1.2 mg/dL    Bilirubin, Direct 0.1  0.0 - 0.3 mg/dL    Indirect Bilirubin 0.2 (*) 0.3 - 0.9 mg/dL   LIPASE, BLOOD     Status: Normal   Collection Time   01/08/12  5:29 AM      Component Value Range Comment   Lipase 27  11 - 59 U/L   URINALYSIS, ROUTINE W REFLEX MICROSCOPIC     Status: Abnormal   Collection Time   01/08/12  5:51 AM      Component Value Range Comment   Color, Urine YELLOW  YELLOW    APPearance CLOUDY (*) CLEAR    Specific Gravity, Urine 1.021  1.005 - 1.030    pH 6.5  5.0 - 8.0    Glucose, UA NEGATIVE  NEGATIVE mg/dL    Hgb urine dipstick MODERATE (*) NEGATIVE    Bilirubin Urine NEGATIVE  NEGATIVE    Ketones, ur NEGATIVE  NEGATIVE mg/dL    Protein, ur NEGATIVE  NEGATIVE mg/dL    Urobilinogen, UA 0.2  0.0 - 1.0 mg/dL    Nitrite NEGATIVE  NEGATIVE    Leukocytes, UA TRACE (*) NEGATIVE   URINE MICROSCOPIC-ADD ON     Status: Abnormal   Collection Time   01/08/12  5:51 AM      Component Value Range Comment   Squamous Epithelial / LPF FEW (*) RARE    WBC, UA 3-6   <3 WBC/hpf    RBC / HPF 0-2  <3 RBC/hpf    Bacteria, UA RARE  RARE    Urine-Other MUCOUS PRESENT     POCT PREGNANCY, URINE     Status: Normal   Collection Time   01/08/12  5:55 AM      Component Value Range Comment   Preg Test, Ur NEGATIVE  NEGATIVE     Ct Abdomen Pelvis W Contrast  01/08/2012  *RADIOLOGY REPORT*  Clinical Data: Right mid to lower quadrant abdominal pain.  Nausea and vomiting.  CT ABDOMEN AND PELVIS WITH CONTRAST  Technique:  Multidetector CT imaging of the abdomen and pelvis was performed following the standard protocol during bolus administration of intravenous contrast.  Contrast: OMNIPAQUE IOHEXOL 300 MG/ML  SOLN  Comparison: None.  Findings: The liver, spleen, pancreas, and adrenal glands appear unremarkable.  The gallbladder and biliary system appear unremarkable.  The kidneys appear unremarkable, as do the proximal ureters.  Abnormal stranding in the right lower quadrant is present, with thickening of the appendix to 1.1 cm compatible with acute appendicitis.  No extraluminal gas or periappendiceal abscess observed.  However, there is a small amount of free pelvic fluid in the cul-de-sac.  Urinary bladder appears normal. The uterus and adnexa appear unremarkable.  No pathologic pelvic adenopathy is identified.  IMPRESSION:  1.  Acute appendicitis without extraluminal gas or abscess.  There is a small amount of fluid in the cul-de-sac.  Original Report Authenticated By: Dellia Cloud, M.D.    Review of Systems  Constitutional: Negative for fever, chills, weight loss, malaise/fatigue and diaphoresis.  HENT:  Negative.   Eyes: Negative.   Respiratory: Negative.   Cardiovascular: Negative.   Gastrointestinal: Positive for abdominal pain. Negative for heartburn, nausea, vomiting, diarrhea, constipation, blood in stool and melena.  Genitourinary: Negative.   Musculoskeletal: Negative.   Skin: Negative.   Neurological: Positive for weakness.  Endo/Heme/Allergies:  Negative.   Psychiatric/Behavioral: Negative.    Blood pressure 134/78, pulse 98, temperature 98 F (36.7 C), temperature source Oral, resp. rate 16, SpO2 100.00%. Physical Exam  Constitutional: She is oriented to person, place, and time. She appears well-developed and well-nourished. No distress.  HENT:  Head: Normocephalic and atraumatic.  Mouth/Throat: No oropharyngeal exudate.  Eyes: Conjunctivae and EOM are normal. Pupils are equal, round, and reactive to light. Right eye exhibits no discharge. Left eye exhibits no discharge. No scleral icterus.  Neck: Normal range of motion. Neck supple. No JVD present. No tracheal deviation present. No thyromegaly present.  Cardiovascular: Normal rate, regular rhythm, normal heart sounds and intact distal pulses.  Exam reveals no gallop and no friction rub.   No murmur heard. Respiratory: Effort normal and breath sounds normal. No stridor. No respiratory distress. She has no wheezes. She has no rales. She exhibits no tenderness.  GI: Soft. Bowel sounds are normal. She exhibits no distension and no mass. There is tenderness. There is rebound and guarding.    Musculoskeletal: Normal range of motion.  Lymphadenopathy:    She has no cervical adenopathy.  Neurological: She is alert and oriented to person, place, and time.  Skin: Skin is warm and dry. No rash noted. She is not diaphoretic. No erythema. No pallor.  Psychiatric: She has a normal mood and affect.    Assessment/Plan: Acute appendicitis  Admit to med surg floor NPO IVF ABX pre-op Laproscopic appendectomy   Mannie Ohlin 01/08/2012, 9:49 AM

## 2012-01-08 NOTE — ED Notes (Signed)
MD at bedside. Admitting MD at bedside

## 2012-01-08 NOTE — ED Notes (Signed)
Called CT to advise that pt has finished contrast

## 2012-01-08 NOTE — ED Notes (Signed)
Pt reports rt side abdominal pain since 6p.m. Pt rates pain at a 6 and states " It hurts when sit back and lay down". Pt denies vaginal discharge, vaginal pain while urinating.

## 2012-01-08 NOTE — Transfer of Care (Signed)
Immediate Anesthesia Transfer of Care Note  Patient: Karen Soto  Procedure(s) Performed: Procedure(s) (LRB): APPENDECTOMY LAPAROSCOPIC (N/A)  Patient Location: PACU  Anesthesia Type: General  Level of Consciousness: awake, alert  and oriented  Airway & Oxygen Therapy: Patient Spontanous Breathing and Patient connected to nasal cannula oxygen  Post-op Assessment: Report given to PACU RN and Post -op Vital signs reviewed and stable  Post vital signs: Reviewed and stable  Complications: No apparent anesthesia complications

## 2012-01-09 MED ORDER — HYDROCODONE-ACETAMINOPHEN 5-325 MG PO TABS: 1.0000 | ORAL_TABLET | ORAL | Status: AC | PRN

## 2012-01-09 NOTE — Progress Notes (Signed)
1 Day Post-Op  Subjective: Sleepy this morning still some appropriate abdominal tenderness, no Flatus, BM. No other c/o offered.  Objective: Vital signs in last 24 hours: Temp:  [97.5 F (36.4 C)-98.6 F (37 C)] 98.5 F (36.9 C) (08/17 0527) Pulse Rate:  [60-98] 74  (08/17 0527) Resp:  [15-20] 16  (08/17 0527) BP: (107-134)/(48-103) 110/60 mmHg (08/17 0527) SpO2:  [99 %-100 %] 100 % (08/17 0527) Weight:  [240 lb (108.863 kg)] 240 lb (108.863 kg) (08/16 1600) Last BM Date: 01/07/12  Intake/Output from previous day: 08/16 0701 - 08/17 0700 In: 2343 [P.O.:240; I.V.:2103] Out: 1810 [Urine:1800; Blood:10] Intake/Output this shift:    General appearance: cooperative, appears stated age and no distress, sleepy this am ? From pain meds. Chest: CTA bilaterally Abdomen: all surgical wounds are well approximated, abdomen is soft, appropriately tender, minimal BS, No flatus, BM, No bloating or N/V. VSS,afebrile, no labs this am.  Lab Results:   Basename 01/08/12 0505  WBC 12.8*  HGB 12.4  HCT 37.5  PLT 246   BMET  Basename 01/08/12 0505  NA 142  K 3.8  CL 106  CO2 25  GLUCOSE 97  BUN 11  CREATININE 0.78  CALCIUM 9.5   PT/INR No results found for this basename: LABPROT:2,INR:2 in the last 72 hours ABG No results found for this basename: PHART:2,PCO2:2,PO2:2,HCO3:2 in the last 72 hours  Studies/Results: Ct Abdomen Pelvis W Contrast  01/08/2012  *RADIOLOGY REPORT*  Clinical Data: Right mid to lower quadrant abdominal pain.  Nausea and vomiting.  CT ABDOMEN AND PELVIS WITH CONTRAST  Technique:  Multidetector CT imaging of the abdomen and pelvis was performed following the standard protocol during bolus administration of intravenous contrast.  Contrast: OMNIPAQUE IOHEXOL 300 MG/ML  SOLN  Comparison: None.  Findings: The liver, spleen, pancreas, and adrenal glands appear unremarkable.  The gallbladder and biliary system appear unremarkable.  The kidneys appear  unremarkable, as do the proximal ureters.  Abnormal stranding in the right lower quadrant is present, with thickening of the appendix to 1.1 cm compatible with acute appendicitis.  No extraluminal gas or periappendiceal abscess observed.  However, there is a small amount of free pelvic fluid in the cul-de-sac.  Urinary bladder appears normal. The uterus and adnexa appear unremarkable.  No pathologic pelvic adenopathy is identified.  IMPRESSION:  1.  Acute appendicitis without extraluminal gas or abscess.  There is a small amount of fluid in the cul-de-sac.  Original Report Authenticated By: Dellia Cloud, M.D.    Anti-infectives: Anti-infectives     Start     Dose/Rate Route Frequency Ordered Stop   01/08/12 1045   piperacillin-tazobactam (ZOSYN) IVPB 3.375 g        3.375 g 12.5 mL/hr over 240 Minutes Intravenous  Once 01/08/12 1036 01/08/12 1116          Assessment/Plan: s/p Procedure(s) (LRB): APPENDECTOMY LAPAROSCOPIC (N/A) 1. Encourage ambulation 2. Continue with clears for now 3. Possible discharge home later today.    LOS: 1 day    Lahari Suttles 01/09/2012

## 2012-01-09 NOTE — Progress Notes (Signed)
Discharge instructions gone over with patient. Home meds gone over. Prescription for pain med given. Signs and symptoms of infection, incisional care, and diet gone over. Patient to make follow up appointment. Verbalized understanding of instructions.

## 2012-01-09 NOTE — Progress Notes (Signed)
Doing OK.  Pain pills working.    D/C later today. Follow up with Dr. Carolynne Edouard.

## 2012-01-09 NOTE — Discharge Summary (Signed)
Follow up with Dr. Carolynne Edouard.

## 2012-01-09 NOTE — Discharge Summary (Signed)
Physician Discharge Summary  Patient ID: Karen Soto MRN: [id number] DOB/AGE: 1990-11-05 21 y.o.  Admit date: 01/08/2012 Discharge date: 01/09/2012  Admission Diagnoses: acute appendicitis  Discharge Diagnoses: s/p laprascopic appendectomy Active Problems:  * No active hospital problems. *    Discharged Condition: stable  Hospital Course: This is a  21 yo AA female who presented to [facility] on 01/08/12  with c/o right abd pain for past past 3 days. She described thisnpain as constant, and dull, non radiating. Denied specific exacerbating or alleviating factors. No change with eating habits recently. Te pain was felt in both RLQ and umbilical region . No pelvic pain. No vaginal discharge or bleeding. Unsure of last period, has been on depo shots. Denies hx same pain. No hx gallstones. No prior abd surgery. No fever or chills. Slightly decreased appetite. No nvd. Denied constipation. No dysuria or urgency. Last BM was yesterday "normal" Last po intake was yesterday morning.  CT of abdomen:  Acute appendicitis without extraluminal gas or abscess. There  is a small amount of fluid in the cul-de-sac. Based on her presentation clinically as well as CT findings and after an in depth discussion with her by Dr. Carolynne Edouard concerning the risks and benefits of a laproscopic appendectomy she elected to go forward with same.  Postop she has done well, her have remained stable, she is afebrile and she has had a return to normal bowel function, and has tolerated a diet.  At this time she will be discharged home to self care with follow up with Dr. Carolynne Edouard in 2 weeks time.   Consults: None  Significant Diagnostic Studies: labs, radiology.  Treatments: IV hydration, antibiotics, analgesia and surgery.  Discharge Exam: Blood pressure 110/60, pulse 74, temperature 98.5 F (36.9 C), temperature source Oral, resp. rate 16, height 5\' 8"  (1.727 m), weight 240 lb (108.863 kg), SpO2 100.00%. General appearance: alert,  cooperative, appears stated age and no distress Chest: CTA bilaterally Cardiac: RRR, no M/R/G Abdomen: Appropriately tender, +BS, no bloating, + flatus no N?V, surgical wounds appear well approximated and w/o erythema or drainage.  Disposition: 01-Home or Self Care   Medication List  As of 01/09/2012 12:13 PM       Signed: Blenda Mounts 01/09/2012, 12:13 PM

## 2012-01-11 ENCOUNTER — Encounter (HOSPITAL_COMMUNITY): Payer: Self-pay | Admitting: General Surgery

## 2012-01-12 ENCOUNTER — Telehealth (INDEPENDENT_AMBULATORY_CARE_PROVIDER_SITE_OTHER): Payer: Self-pay

## 2012-01-12 ENCOUNTER — Encounter (INDEPENDENT_AMBULATORY_CARE_PROVIDER_SITE_OTHER): Payer: Self-pay | Admitting: General Surgery

## 2012-01-12 ENCOUNTER — Telehealth (INDEPENDENT_AMBULATORY_CARE_PROVIDER_SITE_OTHER): Payer: Self-pay | Admitting: General Surgery

## 2012-01-12 NOTE — Telephone Encounter (Signed)
Spoke with pt and let her know that her first PO appt with Dr. Carolynne Edouard will be on 9/10 at 10:00.  She was fine with this.  Informed the pt that I would mail her return to school letter to the address they had listed with [place].

## 2012-01-12 NOTE — Telephone Encounter (Signed)
Pt needs a post op appt and a return to school note from Dr. Carolynne Edouard.  Please call.  Unable to find appt within two weeks.

## 2012-01-12 NOTE — Care Management Note (Signed)
    Page 1 of 1   01/12/2012     4:03:06 PM   CARE MANAGEMENT NOTE 01/12/2012  Patient:  Karen Soto, Karen Soto   Account Number:  [id number]  Date Initiated:  01/12/2012  Documentation initiated by:  Jim Like  Subjective/Objective Assessment:   Pt is a 21 yr old admitted with acute appendicitis     Action/Plan:   No CM/discharge planning needs identified   Anticipated DC Date:  01/09/2012   Anticipated DC Plan:  HOME/SELF CARE         Choice offered to / List presented to:             Status of service:   Medicare Important Message given?   (If response is "NO", the following Medicare IM given date fields will be blank) Date Medicare IM given:   Date Additional Medicare IM given:    Discharge Disposition:  HOME/SELF CARE  Per UR Regulation:  Reviewed for med. necessity/level of care/duration of stay  If discussed at Long Length of Stay Meetings, dates discussed:    Comments:

## 2012-02-02 ENCOUNTER — Ambulatory Visit (INDEPENDENT_AMBULATORY_CARE_PROVIDER_SITE_OTHER): Payer: Medicaid Other | Admitting: General Surgery

## 2012-02-02 ENCOUNTER — Encounter (INDEPENDENT_AMBULATORY_CARE_PROVIDER_SITE_OTHER): Payer: Self-pay | Admitting: General Surgery

## 2012-02-02 VITALS — BP 128/88 | HR 70 | Temp 97.1°F | Resp 16 | Ht 68.0 in | Wt 242.6 lb

## 2012-02-02 DIAGNOSIS — K3580 Unspecified acute appendicitis: Secondary | ICD-10-CM

## 2012-02-02 NOTE — Patient Instructions (Signed)
May return to all normal activities in 2 weeks 

## 2012-02-02 NOTE — Progress Notes (Signed)
Subjective:     Patient ID: Karen Soto, female   DOB: [birth year], 21 y.o.   MRN: [id number]  HPI The patient is a 21 year old female who is 3 weeks status post laparoscopic appendectomy. She denies any abdominal pain. She has some concerned about the scars. She denies any nausea or vomiting. She denies any fevers or chills.  Review of Systems     Objective:   Physical Exam On exam her abdomen is soft and nontender. Her incisions are healing very nicely with no sign of infection.    Assessment:     3 weeks status post laparoscopic appendectomy    Plan:     At this point I think she can return to all activities without restrictions in about 2 weeks. We will plan to see her back on a when necessary basis. I believe her scars will look great in the next couple months.

## 2012-07-17 ENCOUNTER — Emergency Department (HOSPITAL_COMMUNITY)
Admission: EM | Admit: 2012-07-17 | Discharge: 2012-07-17 | Disposition: A | Discharge status: Home / Self Care | Payer: Medicaid Other | Attending: Emergency Medicine | Admitting: Emergency Medicine

## 2012-07-17 ENCOUNTER — Encounter (HOSPITAL_COMMUNITY): Payer: Self-pay | Admitting: Emergency Medicine

## 2012-07-17 DIAGNOSIS — Z79899 Other long term (current) drug therapy: Secondary | ICD-10-CM | POA: Insufficient documentation

## 2012-07-17 DIAGNOSIS — J45909 Unspecified asthma, uncomplicated: Secondary | ICD-10-CM | POA: Insufficient documentation

## 2012-07-17 DIAGNOSIS — R209 Unspecified disturbances of skin sensation: Secondary | ICD-10-CM | POA: Insufficient documentation

## 2012-07-17 DIAGNOSIS — M79609 Pain in unspecified limb: Secondary | ICD-10-CM | POA: Insufficient documentation

## 2012-07-17 DIAGNOSIS — Z8679 Personal history of other diseases of the circulatory system: Secondary | ICD-10-CM | POA: Insufficient documentation

## 2012-07-17 MED ORDER — CYCLOBENZAPRINE HCL 10 MG PO TABS: 10.0000 mg | ORAL_TABLET | Freq: Two times a day (BID) | ORAL | Status: DC | PRN

## 2012-07-17 MED ORDER — NAPROXEN 500 MG PO TABS: 500.0000 mg | ORAL_TABLET | Freq: Two times a day (BID) | ORAL | Status: DC

## 2012-07-17 NOTE — ED Notes (Signed)
Pt c/o right side neck and shoulder pain on and off for 6 years. Pt denies pain at present. " I think I should get it checked out." Nothing associates with the pain, "I guess I may irritate it sometimes."

## 2012-07-17 NOTE — ED Provider Notes (Signed)
History     CSN: [id number]  Arrival date & time 07/17/12  [id number]   First MD Initiated Contact with Patient 07/17/12 0800      Chief Complaint  Patient presents with  . Shoulder Pain  . Neck Pain    (Consider location/radiation/quality/duration/timing/severity/associated sxs/prior treatment) HPI Comments: Patient comes in today due to having right sided neck pain and numbness and tingling of right hand intermittently over the past 6 years.  She denies any pain at this time.  She reports that the pain is associated with some numbness and tingling of all of the fingers of her right hand.  She reports that she has the numbness from her mid forearm distally.  She denies numbness or tingling at this time.  She denies any weakness of her right hand or difficulty grabbing things.  She has a PCP, but has never been evaluated for this previously.  No treatment prior to arrival.  No acute injury or trauma.  When asked what prompted her to come to the ED today she states, "the pain was bad yesterday."    The history is provided by the patient.    Past Medical History  Diagnosis Date  . Asthma   . Drooping eyelid 01/04/2012    right  . Headache 01/08/2012    "lots since Monday (01/04/2012)"    Past Surgical History  Procedure Laterality Date  . Appendectomy  01/08/2012  . Incision and drainage of wound  ~ 2010    "MRSA in her back; had it drained @ the hospital"  . Laparoscopic appendectomy  01/08/2012    Procedure: APPENDECTOMY LAPAROSCOPIC;  Surgeon: Robyne Askew, MD;  Location: [facility] OR;  Service: General;  Laterality: N/A;    No family history on file.  History  Substance Use Topics  . Smoking status: Never Smoker   . Smokeless tobacco: Never Used  . Alcohol Use: No    OB History   Grav Para Term Preterm Abortions TAB SAB Ect Mult Living                  Review of Systems  Constitutional: Negative for fever and chills.  Musculoskeletal: Negative for gait problem.       Right  sided neck pain and right shoulder pain  Skin: Negative for color change.  Neurological: Positive for numbness. Negative for dizziness, syncope, weakness, light-headedness and headaches.    Allergies  Review of patient's allergies indicates no known allergies.  Home Medications   Current Outpatient Rx  Name  Route  Sig  Dispense  Refill  . medroxyPROGESTERone (DEPO-PROVERA) 150 MG/ML injection   Intramuscular   Inject 150 mg into the muscle every 3 (three) months.           BP 129/77  Pulse 78  Temp(Src) 98.5 F (36.9 C) (Oral)  Resp 18  SpO2 100%  Physical Exam  Nursing note and vitals reviewed. Constitutional: She appears well-developed and well-nourished.  HENT:  Head: Normocephalic and atraumatic.  Neck: Normal range of motion. Neck supple. No spinous process tenderness and no muscular tenderness present. Normal range of motion present.  Cardiovascular: Normal rate, regular rhythm and normal heart sounds.   Pulmonary/Chest: Effort normal.  Musculoskeletal: Normal range of motion. She exhibits no edema and no tenderness.       Right shoulder: She exhibits normal range of motion, no tenderness, no bony tenderness, no swelling, no deformity, normal pulse and normal strength.  Muscle tightness of the right trapezius.  Neurological: She is alert. She has normal strength. No cranial nerve deficit or sensory deficit.  Reflex Scores:      Tricep reflexes are 2+ on the right side and 2+ on the left side.      Bicep reflexes are 2+ on the right side and 2+ on the left side.      Brachioradialis reflexes are 2+ on the right side and 2+ on the left side. Distal sensation of all fingers on the right hand intact No numbness or tingling of hand with ROM of the right shoulder, wrist, or elbow Grip strength 5/5 bilaterally  Skin: Skin is warm and dry.  Psychiatric: She has a normal mood and affect.    ED Course  Procedures (including critical care time)  Labs Reviewed - No  data to display No results found.   No diagnosis found.    MDM  Patient presenting with right sided neck pain and right shoulder pain associated with numbness and tingling of the right hand intermittently over the past 6 years.  No acute injury or trauma.  Patient does not have any pain or numbness/tingling at the time of evaluation today.  History of the numbness and tingling is in an unusual distribution for a nerve compression.  Patient does not have any weakness.  Sensation intact at this time.  Feel that patient is stable for discharge and can follow up with her PCP.        Pascal Lux [place], PA-C 07/17/12 [phone]

## 2012-07-17 NOTE — ED Provider Notes (Signed)
Medical screening examination/treatment/procedure(s) were performed by non-physician practitioner and as supervising physician I was immediately available for consultation/collaboration.  Gilda Crease, MD 07/17/12 [phone]

## 2012-07-23 ENCOUNTER — Emergency Department (HOSPITAL_COMMUNITY): Payer: Medicaid Other

## 2012-07-23 ENCOUNTER — Encounter (HOSPITAL_COMMUNITY): Payer: Self-pay

## 2012-07-23 ENCOUNTER — Emergency Department (HOSPITAL_COMMUNITY)
Admission: EM | Admit: 2012-07-23 | Discharge: 2012-07-23 | Disposition: A | Discharge status: Home / Self Care | Payer: Medicaid Other | Attending: Emergency Medicine | Admitting: Emergency Medicine

## 2012-07-23 DIAGNOSIS — R197 Diarrhea, unspecified: Secondary | ICD-10-CM | POA: Insufficient documentation

## 2012-07-23 DIAGNOSIS — Z8679 Personal history of other diseases of the circulatory system: Secondary | ICD-10-CM | POA: Insufficient documentation

## 2012-07-23 DIAGNOSIS — J45909 Unspecified asthma, uncomplicated: Secondary | ICD-10-CM | POA: Insufficient documentation

## 2012-07-23 DIAGNOSIS — Z9089 Acquired absence of other organs: Secondary | ICD-10-CM | POA: Insufficient documentation

## 2012-07-23 DIAGNOSIS — R112 Nausea with vomiting, unspecified: Secondary | ICD-10-CM | POA: Insufficient documentation

## 2012-07-23 DIAGNOSIS — Z79899 Other long term (current) drug therapy: Secondary | ICD-10-CM | POA: Insufficient documentation

## 2012-07-23 DIAGNOSIS — Z8614 Personal history of Methicillin resistant Staphylococcus aureus infection: Secondary | ICD-10-CM | POA: Insufficient documentation

## 2012-07-23 DIAGNOSIS — Z8669 Personal history of other diseases of the nervous system and sense organs: Secondary | ICD-10-CM | POA: Insufficient documentation

## 2012-07-23 DIAGNOSIS — Z3202 Encounter for pregnancy test, result negative: Secondary | ICD-10-CM | POA: Insufficient documentation

## 2012-07-23 LAB — COMPREHENSIVE METABOLIC PANEL
ALT: 30 U/L (ref 0–35)
AST: 26 U/L (ref 0–37)
Albumin: 3.7 g/dL (ref 3.5–5.2)
CO2: 25 mEq/L (ref 19–32)
Calcium: 9.2 mg/dL (ref 8.4–10.5)
GFR calc non Af Amer: >90 mL/min (ref >90)
Sodium: 136 mEq/L (ref 135–145)
Total Protein: 7.2 g/dL (ref 6.0–8.3)

## 2012-07-23 LAB — CBC WITH DIFFERENTIAL/PLATELET
Basophils Absolute: 0 10*3/uL (ref 0.0–0.1)
Eosinophils Relative: 4 % (ref 0–5)
Lymphocytes Relative: 34 % (ref 12–46)
MCV: 77.3 fL — ABNORMAL LOW (ref 78.0–100.0)
Platelets: 186 10*3/uL (ref 150–400)
RDW: 14 % (ref 11.5–15.5)
WBC: 4.2 10*3/uL (ref 4.0–10.5)

## 2012-07-23 LAB — URINALYSIS, MICROSCOPIC ONLY
Bilirubin Urine: NEGATIVE
Glucose, UA: NEGATIVE mg/dL
Hgb urine dipstick: NEGATIVE
Specific Gravity, Urine: 1.027 (ref 1.005–1.030)
pH: 6.5 (ref 5.0–8.0)

## 2012-07-23 LAB — PREGNANCY, URINE: Preg Test, Ur: NEGATIVE

## 2012-07-23 LAB — LIPASE, BLOOD: Lipase: 30 U/L (ref 11–59)

## 2012-07-23 MED ORDER — ONDANSETRON 4 MG PO TBDP: 4.0000 mg | ORAL_TABLET | Freq: Three times a day (TID) | ORAL | Status: DC | PRN

## 2012-07-23 MED ORDER — IBUPROFEN 800 MG PO TABS: 800.0000 mg | ORAL_TABLET | Freq: Three times a day (TID) | ORAL | Status: DC | PRN

## 2012-07-23 MED ORDER — IBUPROFEN 800 MG PO TABS
800.0000 mg | ORAL_TABLET | Freq: Once | ORAL | Status: AC
  Administered 2012-07-23: 800 mg via ORAL
  Filled 2012-07-23: qty 1

## 2012-07-23 NOTE — ED Provider Notes (Signed)
History     CSN: [id number]  Arrival date & time 07/23/12  1204   None     Chief Complaint  Patient presents with  . Abdominal Pain  . Diarrhea    (Consider location/radiation/quality/duration/timing/severity/associated sxs/prior treatment) HPI Comments: Pt presents to the ED for nausea, vomiting, and abdominal discomfort x 1 week.  States symptoms have worsened over the past week.  Feels that she is hungry but sometimes gets nauseous whenever she attempts to eat.  Last normal BM was approximately 1 week ago.  For the past few days, she has been having watery diarrhea.  No blood noted in stool or emesis.  Denies any chest pain, SOB, dysuria, fever, or vaginal discharge.  Appendectomy in 2013.  No hx of gallbladder disease.  Patient is a 22 y.o. female presenting with abdominal pain and diarrhea. The history is provided by the patient and a parent.  Abdominal Pain Associated symptoms: diarrhea, nausea and vomiting   Diarrhea Associated symptoms: abdominal pain and vomiting     Past Medical History  Diagnosis Date  . Asthma   . Drooping eyelid 01/04/2012    right  . Headache 01/08/2012    "lots since Monday (01/04/2012)"    Past Surgical History  Procedure Laterality Date  . Appendectomy  01/08/2012  . Incision and drainage of wound  ~ 2010    "MRSA in her back; had it drained @ the hospital"  . Laparoscopic appendectomy  01/08/2012    Procedure: APPENDECTOMY LAPAROSCOPIC;  Surgeon: Robyne Askew, MD;  Location: [facility] OR;  Service: General;  Laterality: N/A;    History reviewed. No pertinent family history.  History  Substance Use Topics  . Smoking status: Never Smoker   . Smokeless tobacco: Never Used  . Alcohol Use: Yes     Comment: occ    OB History   Grav Para Term Preterm Abortions TAB SAB Ect Mult Living                  Review of Systems  Gastrointestinal: Positive for nausea, vomiting, abdominal pain and diarrhea.  All other systems reviewed and are  negative.    Allergies  Review of patient's allergies indicates no known allergies.  Home Medications   Current Outpatient Rx  Name  Route  Sig  Dispense  Refill  . cyclobenzaprine (FLEXERIL) 10 MG tablet   Oral   Take 1 tablet (10 mg total) by mouth 2 (two) times daily as needed for muscle spasms.   10 tablet   0   . medroxyPROGESTERone (DEPO-PROVERA) 150 MG/ML injection   Intramuscular   Inject 150 mg into the muscle every 3 (three) months.         . naproxen (NAPROSYN) 500 MG tablet   Oral   Take 1 tablet (500 mg total) by mouth 2 (two) times daily.   30 tablet   0     BP 123/71  Pulse 87  Temp(Src) 98.6 F (37 C) (Oral)  Resp 16  SpO2 100%  Physical Exam  Nursing note and vitals reviewed. Constitutional: She is oriented to person, place, and time. She appears well-developed and well-nourished.  HENT:  Head: Normocephalic and atraumatic.  Mouth/Throat: Oropharynx is clear and moist.  Eyes: Conjunctivae and EOM are normal.  Neck: Normal range of motion. Neck supple.  Cardiovascular: Normal rate, regular rhythm and normal heart sounds.   Pulmonary/Chest: Effort normal and breath sounds normal. She has no wheezes.  Abdominal: Soft. Bowel sounds are  normal. She exhibits no distension. There is no tenderness. There is no CVA tenderness and negative Murphy's sign.  No McBurney's- appendix surgically absent  Musculoskeletal: Normal range of motion.  Lymphadenopathy:    She has no cervical adenopathy.  Neurological: She is alert and oriented to person, place, and time.  Skin: Skin is warm and dry.  Psychiatric: She has a normal mood and affect.    ED Course  Procedures (including critical care time)  Labs Reviewed  CBC WITH DIFFERENTIAL - Abnormal; Notable for the following:    RBC 5.19 (*)    MCV 77.3 (*)    All other components within normal limits  COMPREHENSIVE METABOLIC PANEL - Abnormal; Notable for the following:    Total Bilirubin 0.2 (*)    All  other components within normal limits  URINALYSIS, MICROSCOPIC ONLY - Abnormal; Notable for the following:    APPearance CLOUDY (*)    Protein, ur 100 (*)    Leukocytes, UA SMALL (*)    All other components within normal limits  PREGNANCY, URINE  LIPASE, BLOOD   Dg Abd Acute W/chest  07/23/2012  *RADIOLOGY REPORT*  Clinical Data: Diarrhea, vomiting  ACUTE ABDOMEN SERIES (ABDOMEN 2 VIEW & CHEST 1 VIEW)  Comparison: CT abdomen pelvis dated 01/08/2012  Findings: Lungs are clear. No pleural effusion or pneumothorax.  Cardiomediastinal silhouette is within normal limits.  Nonobstructive bowel gas pattern.  No evidence of free air under the diaphragm on the upright view.  Visualized osseous structures are within normal limits.  IMPRESSION: No evidence of acute cardiopulmonary disease.  No evidence of small bowel obstruction or free air.   Original Report Authenticated By: Charline Bills, M.D.      1. Nausea   2. Vomiting   3. Diarrhea       MDM   22 year old female presenting with N/V/D x 5 days.  Work up including CBC, CMP, Lipase, U/A, and acute abd series largely unremarkable. U-preg negative. PE is benign. Appendix surgically absent- low suspicion for other acute processes.  Pt remained asx and was able to tolerate PO food and liquids while in the ED.  Discussed with Dr. Clarene Duke- pt ok for discharge.  Rx for zofran and ibuprofen to use PRN.  Return to the ED for new or worsening symptoms.      Garlon Hatchet, PA-C 07/23/12 1718

## 2012-07-23 NOTE — ED Notes (Addendum)
Pt c/o abd pain, n/v/d x4 days. Pt stopped vomiting yesterday. C/o dec appetite. Pt also c/o neck and right shoulder x3 years. Pt has taken muscle relaxers for pain with no relief

## 2012-07-26 NOTE — ED Provider Notes (Signed)
Medical screening examination/treatment/procedure(s) were performed by non-physician practitioner and as supervising physician I was immediately available for consultation/collaboration.   Laray Anger, DO 07/26/12 1319

## 2012-10-19 ENCOUNTER — Encounter (HOSPITAL_COMMUNITY): Payer: Self-pay | Admitting: Emergency Medicine

## 2012-10-19 ENCOUNTER — Emergency Department (HOSPITAL_COMMUNITY)
Admission: EM | Admit: 2012-10-19 | Discharge: 2012-10-19 | Disposition: A | Discharge status: Home / Self Care | Payer: Medicaid Other | Attending: Emergency Medicine | Admitting: Emergency Medicine

## 2012-10-19 DIAGNOSIS — R109 Unspecified abdominal pain: Secondary | ICD-10-CM | POA: Insufficient documentation

## 2012-10-19 DIAGNOSIS — Z8669 Personal history of other diseases of the nervous system and sense organs: Secondary | ICD-10-CM | POA: Insufficient documentation

## 2012-10-19 DIAGNOSIS — R102 Pelvic and perineal pain: Secondary | ICD-10-CM

## 2012-10-19 DIAGNOSIS — E669 Obesity, unspecified: Secondary | ICD-10-CM | POA: Insufficient documentation

## 2012-10-19 DIAGNOSIS — N949 Unspecified condition associated with female genital organs and menstrual cycle: Secondary | ICD-10-CM | POA: Insufficient documentation

## 2012-10-19 DIAGNOSIS — J45909 Unspecified asthma, uncomplicated: Secondary | ICD-10-CM | POA: Insufficient documentation

## 2012-10-19 DIAGNOSIS — F1210 Cannabis abuse, uncomplicated: Secondary | ICD-10-CM | POA: Insufficient documentation

## 2012-10-19 HISTORY — DX: Obesity, unspecified: E66.9

## 2012-10-19 NOTE — ED Notes (Signed)
PT. REQUESTING IUD REMOVE , " I DON'T LIKE IT " , PT. STATED IUD INSERTED AT HEALTH DEPT. THIS AFTERNOON .

## 2012-10-19 NOTE — ED Notes (Signed)
Patient had Mirena placed today at the health department.  She states that she wants it out.  It is causing her pain.  Denies any vaginal discharge or bleeding.

## 2012-10-19 NOTE — ED Provider Notes (Signed)
Medical screening examination/treatment/procedure(s) were performed by non-physician practitioner and as supervising physician I was immediately available for consultation/collaboration.  Toy Baker, MD 10/19/12 2330

## 2012-10-19 NOTE — ED Provider Notes (Signed)
History  This chart was scribed for Toy Baker, MD by Ardelia Mems, ED Scribe. This patient was seen in room TR09C/TR09C and the patient's care was started at 10:23 PM.   CSN: [id number]  Arrival date & time 10/19/12  2025    No chief complaint on file.   The history is provided by the patient. No language interpreter was used.    HPI Comments: Karen Soto is a 22 y.o. female who presents to the Emergency Department complaining of constant, moderate vaginal discomfort. Pt states that she had a Merina IUD put in this afternoon at a health department. She states that she "doesn't like it" and that she wants it removed. Pt denies smoking and is an occasional alcohol user. Pt has a history of marijuana use.  Past Medical History  Diagnosis Date  . Asthma   . Drooping eyelid 01/04/2012    right  . Headache(784.0) 01/08/2012    "lots since Monday (01/04/2012)"  . Obesity     Past Surgical History  Procedure Laterality Date  . Appendectomy  01/08/2012  . Incision and drainage of wound  ~ 2010    "MRSA in her back; had it drained @ the hospital"  . Laparoscopic appendectomy  01/08/2012    Procedure: APPENDECTOMY LAPAROSCOPIC;  Surgeon: Robyne Askew, MD;  Location: [facility] OR;  Service: General;  Laterality: N/A;    No family history on file.  History  Substance Use Topics  . Smoking status: Never Smoker   . Smokeless tobacco: Never Used  . Alcohol Use: Yes     Comment: occ    OB History   Grav Para Term Preterm Abortions TAB SAB Ect Mult Living                  Review of Systems  Constitutional: Negative for fever.  Gastrointestinal: Positive for abdominal pain.  Genitourinary: Negative for vaginal bleeding and vaginal discharge.    Allergies  Review of patient's allergies indicates no known allergies.  Home Medications   Current Outpatient Rx  Name  Route  Sig  Dispense  Refill  . medroxyPROGESTERone (DEPO-PROVERA) 150 MG/ML injection   Intramuscular  Inject 150 mg into the muscle every 3 (three) months.           Triage Vitals: BP 136/77  Pulse 81  Temp(Src) 98.8 F (37.1 C) (Oral)  Resp 14  SpO2 99%  Physical Exam  Nursing note and vitals reviewed. Constitutional: She is oriented to person, place, and time. She appears well-developed and well-nourished.  HENT:  Head: Normocephalic.  Neck: Normal range of motion. Neck supple.  Pulmonary/Chest: Effort normal.  Abdominal: Soft. Bowel sounds are normal. There is no rebound and no guarding.  Suprapubic tenderness without guarding.   Genitourinary: Vagina normal and uterus normal. No vaginal discharge found.  No adnexal mass or tenderness. IUD in cervical os.  Musculoskeletal: Normal range of motion.  Neurological: She is alert and oriented to person, place, and time.  Skin: Skin is warm and dry. No rash noted.  Psychiatric: She has a normal mood and affect.    ED Course  Procedures (including critical care time)  DIAGNOSTIC STUDIES: Oxygen Saturation is 99% on RA, normal by my interpretation.    COORDINATION OF CARE: 10:44 PM- Pt advised of plan for treatment and pt agrees.     Labs Reviewed - No data to display No results found.   No diagnosis found.  1. Pelvic pain  MDM  IUD  removed without difficulty.         I personally performed the services described in this documentation, which was scribed in my presence. The recorded information has been reviewed and is accurate.      Arnoldo Hooker, PA-C 10/19/12 2328

## 2012-10-19 NOTE — ED Notes (Signed)
PT comfortable with f/u and d/c instructions. No prescriptions

## 2014-12-22 ENCOUNTER — Emergency Department (HOSPITAL_COMMUNITY): Payer: Medicaid Other

## 2014-12-22 ENCOUNTER — Encounter (HOSPITAL_COMMUNITY): Payer: Self-pay | Admitting: *Deleted

## 2014-12-22 ENCOUNTER — Emergency Department (HOSPITAL_COMMUNITY)
Admission: EM | Admit: 2014-12-22 | Discharge: 2014-12-22 | Disposition: A | Discharge status: Home / Self Care | Payer: Medicaid Other | Attending: Emergency Medicine | Admitting: Emergency Medicine

## 2014-12-22 DIAGNOSIS — R42 Dizziness and giddiness: Secondary | ICD-10-CM | POA: Insufficient documentation

## 2014-12-22 DIAGNOSIS — Z3A09 9 weeks gestation of pregnancy: Secondary | ICD-10-CM | POA: Diagnosis not present

## 2014-12-22 DIAGNOSIS — O210 Mild hyperemesis gravidarum: Secondary | ICD-10-CM | POA: Diagnosis not present

## 2014-12-22 DIAGNOSIS — O9989 Other specified diseases and conditions complicating pregnancy, childbirth and the puerperium: Secondary | ICD-10-CM | POA: Insufficient documentation

## 2014-12-22 DIAGNOSIS — R0602 Shortness of breath: Secondary | ICD-10-CM | POA: Diagnosis not present

## 2014-12-22 DIAGNOSIS — R51 Headache: Secondary | ICD-10-CM | POA: Insufficient documentation

## 2014-12-22 DIAGNOSIS — R112 Nausea with vomiting, unspecified: Secondary | ICD-10-CM

## 2014-12-22 LAB — COMPREHENSIVE METABOLIC PANEL
ALBUMIN: 3.9 g/dL (ref 3.5–5.0)
ALK PHOS: 56 U/L (ref 38–126)
ALT: 17 U/L (ref 14–54)
ANION GAP: 9 (ref 5–15)
AST: 18 U/L (ref 15–41)
BUN: 7 mg/dL (ref 6–20)
CO2: 21 mmol/L — ABNORMAL LOW (ref 22–32)
CREATININE: 0.69 mg/dL (ref 0.44–1.00)
Calcium: 9.1 mg/dL (ref 8.9–10.3)
Chloride: 104 mmol/L (ref 101–111)
GFR calc non Af Amer: >60 mL/min (ref >60)
GLUCOSE: 87 mg/dL (ref 65–99)
POTASSIUM: 3.6 mmol/L (ref 3.5–5.1)
Sodium: 134 mmol/L — ABNORMAL LOW (ref 135–145)
Total Bilirubin: 0.4 mg/dL (ref 0.3–1.2)
Total Protein: 6.9 g/dL (ref 6.5–8.1)

## 2014-12-22 LAB — URINALYSIS, ROUTINE W REFLEX MICROSCOPIC
BILIRUBIN URINE: NEGATIVE
Glucose, UA: NEGATIVE mg/dL
Hgb urine dipstick: NEGATIVE
Ketones, ur: NEGATIVE mg/dL
NITRITE: NEGATIVE
PH: 7 (ref 5.0–8.0)
Protein, ur: 100 mg/dL — AB
Specific Gravity, Urine: 1.022 (ref 1.005–1.030)
Urobilinogen, UA: 0.2 mg/dL (ref 0.0–1.0)

## 2014-12-22 LAB — CBC
HEMATOCRIT: 41.1 % (ref 36.0–46.0)
Hemoglobin: 13.9 g/dL (ref 12.0–15.0)
MCH: 27 pg (ref 26.0–34.0)
MCHC: 33.8 g/dL (ref 30.0–36.0)
MCV: 80 fL (ref 78.0–100.0)
Platelets: 212 10*3/uL (ref 150–400)
RBC: 5.14 MIL/uL — ABNORMAL HIGH (ref 3.87–5.11)
RDW: 13.6 % (ref 11.5–15.5)
WBC: 9.8 10*3/uL (ref 4.0–10.5)

## 2014-12-22 LAB — URINE MICROSCOPIC-ADD ON

## 2014-12-22 LAB — HCG, QUANTITATIVE, PREGNANCY: HCG, BETA CHAIN, QUANT, S: 121695 m[IU]/mL — AB (ref <5)

## 2014-12-22 LAB — WET PREP, GENITAL
Trich, Wet Prep: NONE SEEN
YEAST WET PREP: NONE SEEN

## 2014-12-22 LAB — I-STAT BETA HCG BLOOD, ED (MC, WL, AP ONLY): I-stat hCG, quantitative: >2000 m[IU]/mL — ABNORMAL HIGH (ref <5)

## 2014-12-22 LAB — LIPASE, BLOOD: Lipase: 20 U/L — ABNORMAL LOW (ref 22–51)

## 2014-12-22 MED ORDER — RHO D IMMUNE GLOBULIN 1500 UNIT/2ML IJ SOSY
300.0000 ug | PREFILLED_SYRINGE | Freq: Once | INTRAMUSCULAR | Status: AC
  Administered 2014-12-22: 300 ug via INTRAMUSCULAR

## 2014-12-22 MED ORDER — SODIUM CHLORIDE 0.9 % IV BOLUS (SEPSIS)
1000.0000 mL | Freq: Once | INTRAVENOUS | Status: AC
  Administered 2014-12-22: 1000 mL via INTRAVENOUS

## 2014-12-22 MED ORDER — ONDANSETRON HCL 4 MG/2ML IJ SOLN
4.0000 mg | Freq: Once | INTRAMUSCULAR | Status: AC
  Administered 2014-12-22: 4 mg via INTRAVENOUS
  Filled 2014-12-22: qty 2

## 2014-12-22 MED ORDER — PROMETHAZINE HCL 25 MG PO TABS: 25.0000 mg | ORAL_TABLET | Freq: Four times a day (QID) | ORAL | Status: DC | PRN

## 2014-12-22 NOTE — Discharge Instructions (Signed)
Follow up with [facility] outpatient clinic for further evaluation and management of your pregnancy. Refer to attached documents for more information.

## 2014-12-22 NOTE — ED Notes (Signed)
Pt ate her saltine crackers and drank her sprite with no nausea or vomiting.

## 2014-12-22 NOTE — ED Provider Notes (Signed)
CSN: [id number]     Arrival date & time 12/22/14  1113 History   First MD Initiated Contact with Patient 12/22/14 1131     Chief Complaint  Patient presents with  . Emesis During Pregnancy  . Headache     (Consider location/radiation/quality/duration/timing/severity/associated sxs/prior Treatment) HPI Patient is a 24 year old female with no past medical history who presents the ER complaining of emesis and nausea in her first trimester of pregnancy. Patient states she believes she is approximately 8-[redacted] weeks pregnant. For the past week and a half she has reported feeling persistent nausea only when standing. Patient also reports that when standing and walking she is feeling mildly lightheadedness, and mild shortness of breath. She states when she sits down these symptoms resolved completely. She states this has been ongoing like this, and as long she is sitting still she is completely asymptomatic. Patient states was standing up she becomes nauseated, and is having difficulty keeping food or fluids down. Patient reports some vaginal discharge that is discolored and has been ongoing for the past several days. Patient denies blurred vision, weakness, chest pain,   History reviewed. No pertinent past medical history. Past Surgical History  Procedure Laterality Date  . Appendectomy     No family history on file. History  Substance Use Topics  . Smoking status: Never Smoker   . Smokeless tobacco: Not on file  . Alcohol Use: No   OB History    No data available     Review of Systems  Constitutional: Negative for fever.  HENT: Negative for trouble swallowing.   Eyes: Negative for visual disturbance.  Respiratory: Negative for shortness of breath.   Cardiovascular: Negative for chest pain.  Gastrointestinal: Positive for nausea and vomiting. Negative for abdominal pain.  Genitourinary: Negative for dysuria.  Musculoskeletal: Negative for neck pain.  Skin: Negative for rash.   Neurological: Positive for light-headedness. Negative for dizziness, weakness and numbness.  Psychiatric/Behavioral: Negative.       Allergies  Review of patient's allergies indicates no known allergies.  Home Medications   Prior to Admission medications   Medication Sig Start Date End Date Taking? Authorizing Provider  promethazine (PHENERGAN) 25 MG tablet Take 1 tablet (25 mg total) by mouth every 6 (six) hours as needed for nausea or vomiting. 12/22/14   Emilia Beck, PA-C   BP 107/62 mmHg  Pulse 64  Temp(Src) 98 F (36.7 C) (Oral)  Resp 20  Wt 206 lb 4 oz (93.554 kg)  SpO2 100%  LMP 10/25/2014 Physical Exam  Constitutional: She is oriented to person, place, and time. She appears well-developed and well-nourished. No distress.  HENT:  Head: Normocephalic and atraumatic.  Mouth/Throat: Oropharynx is clear and moist. No oropharyngeal exudate.  Eyes: Right eye exhibits no discharge. Left eye exhibits no discharge. No scleral icterus.  Neck: Normal range of motion.  Cardiovascular: Normal rate, regular rhythm and normal heart sounds.   No murmur heard. Pulmonary/Chest: Effort normal and breath sounds normal. No respiratory distress.  Abdominal: Soft. There is no tenderness.  Genitourinary: No labial fusion. There is no rash, tenderness, lesion or injury on the right labia. There is no rash, tenderness, lesion or injury on the left labia. No erythema, tenderness or bleeding in the vagina. No signs of injury around the vagina. No vaginal discharge found.  Mild, white colored discharge noted in vaginal vault. No cervical motion tenderness, friability or discharge. No adnexal tenderness. Chaperone present during entire pelvic exam.  Musculoskeletal: Normal range of motion. She  exhibits no edema or tenderness.  Neurological: She is alert and oriented to person, place, and time. No cranial nerve deficit. Coordination normal.  Skin: Skin is warm and dry. No rash noted. She is not  diaphoretic.  Psychiatric: She has a normal mood and affect.  Nursing note and vitals reviewed.   ED Course  Procedures (including critical care time) Labs Review Labs Reviewed  WET PREP, GENITAL - Abnormal; Notable for the following:    Clue Cells Wet Prep HPF POC FEW (*)    WBC, Wet Prep HPF POC FEW (*)    All other components within normal limits  LIPASE, BLOOD - Abnormal; Notable for the following:    Lipase 20 (*)    All other components within normal limits  COMPREHENSIVE METABOLIC PANEL - Abnormal; Notable for the following:    Sodium 134 (*)    CO2 21 (*)    All other components within normal limits  CBC - Abnormal; Notable for the following:    RBC 5.14 (*)    All other components within normal limits  URINALYSIS, ROUTINE W REFLEX MICROSCOPIC (NOT AT [facility]) - Abnormal; Notable for the following:    APPearance HAZY (*)    Protein, ur 100 (*)    Leukocytes, UA TRACE (*)    All other components within normal limits  HCG, QUANTITATIVE, PREGNANCY - Abnormal; Notable for the following:    hCG, Beta Chain, [place] Longest [id number] (*)    All other components within normal limits  URINE MICROSCOPIC-ADD ON - Abnormal; Notable for the following:    Squamous Epithelial / LPF MANY (*)    Bacteria, UA FEW (*)    All other components within normal limits  I-STAT BETA HCG BLOOD, ED ([facility], [facility], [facility] ONLY) - Abnormal; Notable for the following:    I-stat hCG, quantitative >2000.0 (*)    All other components within normal limits  HIV ANTIBODY (ROUTINE TESTING)  ABO/RH  RH IG WORKUP (INCLUDES ABO/RH)  GC/CHLAMYDIA PROBE AMP ([place]) NOT AT [facility]    Imaging Review US Ob Comp Less 14 Wks  12/22/2014   CLINICAL DATA:  Pregnant.  Nausea, vomiting.  EXAM: OBSTETRIC <14 WK [place] AND TRANSVAGINAL OB US  TECHNIQUE: Both transabdominal and transvaginal ultrasound examinations were performed for complete evaluation of the gestation as well as the maternal uterus, adnexal regions, and pelvic cul-de-sac.  Transvaginal technique was performed to assess early pregnancy.  COMPARISON:  None.  FINDINGS: Intrauterine gestational sac: Visualized/normal in shape.  Yolk sac:  Visualized  Embryo:  Visualized  Cardiac Activity: Visualize  Heart Rate: 155  bpm  MSD:   mm    w     d  CRL:  21.1  mm   8 w   5 d                  [place] EDC: 07/29/2015  Maternal uterus/adnexae: No subchorionic hemorrhage. No adnexal masses or free fluid. Right corpus luteal cyst.  IMPRESSION: Eight week 5 day intrauterine pregnancy. Fetal heart rate 155 beats per minute. No acute maternal findings.   Electronically Signed   By: Charlett Nose M.D.   On: 12/22/2014 17:03   US Ob Transvaginal  12/22/2014   CLINICAL DATA:  Pregnant.  Nausea, vomiting.  EXAM: OBSTETRIC <14 WK [place] AND TRANSVAGINAL OB US  TECHNIQUE: Both transabdominal and transvaginal ultrasound examinations were performed for complete evaluation of the gestation as well as the maternal uterus, adnexal regions, and pelvic cul-de-sac. Transvaginal  technique was performed to assess early pregnancy.  COMPARISON:  None.  FINDINGS: Intrauterine gestational sac: Visualized/normal in shape.  Yolk sac:  Visualized  Embryo:  Visualized  Cardiac Activity: Visualize  Heart Rate: 155  bpm  MSD:   mm    w     d  CRL:  21.1  mm   8 w   5 d                  [place] EDC: 07/29/2015  Maternal uterus/adnexae: No subchorionic hemorrhage. No adnexal masses or free fluid. Right corpus luteal cyst.  IMPRESSION: Eight week 5 day intrauterine pregnancy. Fetal heart rate 155 beats per minute. No acute maternal findings.   Electronically Signed   By: Charlett Nose M.D.   On: 12/22/2014 17:03     EKG Interpretation None      MDM   Final diagnoses:  Nausea & vomiting    Pt here with nausea, vomiting and light-headedness and a first trimester pregnancy. Patient is afebrile, hemodynamically stable and in no acute distress. Patient completely asymptomatic when sitting still. States her symptoms are only when  standing up. Patient treated symptomatically here, tolerating by mouth well. We'll follow up with OB ultrasound.  Patient signed out to [facility], PA-C with plan to f/u on OB US, and d/c if tolerating PO well to f/u with OBGYN as OP.    Signed,  Ladona Mow, PA-C 7:55 AM     Ladona Mow, PA-C 12/23/14 1610  Elwin Mocha, MD 12/23/14 [phone]

## 2014-12-22 NOTE — ED Notes (Signed)
Pt A&OX4, ambulatory at d/c with steady gait, NAD. Pt states she has all of her belongings with her at discharge.

## 2014-12-22 NOTE — ED Provider Notes (Signed)
3:35 PM Patient signed out to me by Ladona Mow, PA-C. Patient pending pelvis US and PO challenge. Vitals stable and patient afebrile.   6:06 PM US shows IUP without abnormality. Patient ambulated with 100% oxygen on room air. Patient will be discharged with [facility] follow up.   Results for orders placed or performed during the hospital encounter of 12/22/14  Wet prep, genital  Result Value Ref Range   Yeast Wet Prep HPF POC NONE SEEN NONE SEEN   Trich, Wet Prep NONE SEEN NONE SEEN   Clue Cells Wet Prep HPF POC FEW (A) NONE SEEN   WBC, Wet Prep HPF POC FEW (A) NONE SEEN  Lipase, blood  Result Value Ref Range   Lipase 20 (L) 22 - 51 U/L  Comprehensive metabolic panel  Result Value Ref Range   Sodium 134 (L) 135 - 145 mmol/L   Potassium 3.6 3.5 - 5.1 mmol/L   Chloride 104 101 - 111 mmol/L   CO2 21 (L) 22 - 32 mmol/L   Glucose, Bld 87 65 - 99 mg/dL   BUN 7 6 - 20 mg/dL   Creatinine, Ser 0.27 0.44 - 1.00 mg/dL   Calcium 9.1 8.9 - 25.3 mg/dL   Total Protein 6.9 6.5 - 8.1 g/dL   Albumin 3.9 3.5 - 5.0 g/dL   AST 18 15 - 41 U/L   ALT 17 14 - 54 U/L   Alkaline Phosphatase 56 38 - 126 U/L   Total Bilirubin 0.4 0.3 - 1.2 mg/dL   GFR calc non Af Amer >60 >60 mL/min   GFR calc Af Amer >60 >60 mL/min   Anion gap 9 5 - 15  CBC  Result Value Ref Range   WBC 9.8 4.0 - 10.5 K/uL   RBC 5.14 (H) 3.87 - 5.11 MIL/uL   Hemoglobin 13.9 12.0 - 15.0 g/dL   HCT 66.4 40.3 - 47.4 %   MCV 80.0 78.0 - 100.0 fL   MCH 27.0 26.0 - 34.0 pg   MCHC 33.8 30.0 - 36.0 g/dL   RDW 25.9 56.3 - 87.5 %   Platelets 212 150 - 400 K/uL  Urinalysis, Routine w reflex microscopic (not at [facility])  Result Value Ref Range   Color, Urine YELLOW YELLOW   APPearance HAZY (A) CLEAR   Specific Gravity, Urine 1.022 1.005 - 1.030   pH 7.0 5.0 - 8.0   Glucose, UA NEGATIVE NEGATIVE mg/dL   Hgb urine dipstick NEGATIVE NEGATIVE   Bilirubin Urine NEGATIVE NEGATIVE   Ketones, ur NEGATIVE NEGATIVE mg/dL   Protein, ur 643  (A) NEGATIVE mg/dL   Urobilinogen, UA 0.2 0.0 - 1.0 mg/dL   Nitrite NEGATIVE NEGATIVE   Leukocytes, UA TRACE (A) NEGATIVE  hCG, quantitative, pregnancy  Result Value Ref Range   hCG, Beta Chain, Quant, S 329518 (H) <5 mIU/mL  Urine microscopic-add on  Result Value Ref Range   Squamous Epithelial / LPF MANY (A) RARE   WBC, UA 3-6 <3 WBC/hpf   RBC / HPF 0-2 <3 RBC/hpf   Bacteria, UA FEW (A) RARE   Urine-Other MUCOUS PRESENT   I-Stat beta hCG blood, ED ([facility], [facility], [facility] only)  Result Value Ref Range   I-stat hCG, quantitative >2000.0 (H) <5 mIU/mL   Comment 3          ABO/Rh  Result Value Ref Range   ABO/RH(D) O NEG   Rh IG workup (includes ABO/Rh)  Result Value Ref Range   Gestational Age(Wks) 8wks to 9wks of  gestation    ABO/RH(D) O NEG    Antibody Screen NEG    Unit Number [id number]    Blood Component Type RHIG    Unit division 00    Status of Unit ISSUED    Transfusion Status OK TO TRANSFUSE    US Ob Comp Less 14 Wks  12/22/2014   CLINICAL DATA:  Pregnant.  Nausea, vomiting.  EXAM: OBSTETRIC <14 WK [place] AND TRANSVAGINAL OB US  TECHNIQUE: Both transabdominal and transvaginal ultrasound examinations were performed for complete evaluation of the gestation as well as the maternal uterus, adnexal regions, and pelvic cul-de-sac. Transvaginal technique was performed to assess early pregnancy.  COMPARISON:  None.  FINDINGS: Intrauterine gestational sac: Visualized/normal in shape.  Yolk sac:  Visualized  Embryo:  Visualized  Cardiac Activity: Visualize  Heart Rate: 155  bpm  MSD:   mm    w     d  CRL:  21.1  mm   8 w   5 d                  [place] EDC: 07/29/2015  Maternal uterus/adnexae: No subchorionic hemorrhage. No adnexal masses or free fluid. Right corpus luteal cyst.  IMPRESSION: Eight week 5 day intrauterine pregnancy. Fetal heart rate 155 beats per minute. No acute maternal findings.   Electronically Signed   By: Charlett Nose M.D.   On: 12/22/2014 17:03   US Ob Transvaginal  12/22/2014    CLINICAL DATA:  Pregnant.  Nausea, vomiting.  EXAM: OBSTETRIC <14 WK [place] AND TRANSVAGINAL OB US  TECHNIQUE: Both transabdominal and transvaginal ultrasound examinations were performed for complete evaluation of the gestation as well as the maternal uterus, adnexal regions, and pelvic cul-de-sac. Transvaginal technique was performed to assess early pregnancy.  COMPARISON:  None.  FINDINGS: Intrauterine gestational sac: Visualized/normal in shape.  Yolk sac:  Visualized  Embryo:  Visualized  Cardiac Activity: Visualize  Heart Rate: 155  bpm  MSD:   mm    w     d  CRL:  21.1  mm   8 w   5 d                  [place] EDC: 07/29/2015  Maternal uterus/adnexae: No subchorionic hemorrhage. No adnexal masses or free fluid. Right corpus luteal cyst.  IMPRESSION: Eight week 5 day intrauterine pregnancy. Fetal heart rate 155 beats per minute. No acute maternal findings.   Electronically Signed   By: Charlett Nose M.D.   On: 12/22/2014 17:03      Emilia Beck, PA-C 12/22/14 1807  Eber Hong, MD 12/23/14 [phone]

## 2014-12-22 NOTE — ED Notes (Signed)
Pt given sprite and saltine crackers per PO challenge. Will re-assess shortly.

## 2014-12-22 NOTE — ED Notes (Signed)
Pt ambulated in hallway with steady gait, O2 sats maintained at 100% and HR. Pt denied SOB, nausea or dizziness.

## 2014-12-22 NOTE — ED Notes (Signed)
Pt states that she has had vomitting with her pregnancy and has not been able to keep food down. Pt reports being [redacted] weeks pregnant. Pt also reports a headache

## 2014-12-23 LAB — HIV ANTIBODY (ROUTINE TESTING W REFLEX): HIV Screen 4th Generation wRfx: NONREACTIVE

## 2014-12-23 LAB — RH IG WORKUP (INCLUDES ABO/RH)
ABO/RH(D): O NEG
ANTIBODY SCREEN: NEGATIVE
Unit division: 0

## 2014-12-23 LAB — ABO/RH: ABO/RH(D): O NEG

## 2014-12-24 LAB — GC/CHLAMYDIA PROBE AMP (~~LOC~~) NOT AT ARMC
CHLAMYDIA, DNA PROBE: NEGATIVE
Neisseria Gonorrhea: NEGATIVE

## 2015-02-04 LAB — OB RESULTS CONSOLE HEPATITIS B SURFACE ANTIGEN: Hepatitis B Surface Ag: NEGATIVE

## 2015-02-04 LAB — OB RESULTS CONSOLE PLATELET COUNT: PLATELETS: 231 10*3/uL

## 2015-02-04 LAB — OB RESULTS CONSOLE GC/CHLAMYDIA: Gonorrhea: NEGATIVE

## 2015-02-05 ENCOUNTER — Encounter (HOSPITAL_COMMUNITY): Payer: Self-pay | Admitting: *Deleted

## 2015-02-11 ENCOUNTER — Encounter: Payer: Self-pay | Admitting: Obstetrics & Gynecology

## 2015-02-11 ENCOUNTER — Ambulatory Visit (INDEPENDENT_AMBULATORY_CARE_PROVIDER_SITE_OTHER): Payer: Medicaid Other | Admitting: Obstetrics & Gynecology

## 2015-02-11 VITALS — BP 128/67 | HR 79 | Wt 213.0 lb

## 2015-02-11 DIAGNOSIS — N912 Amenorrhea, unspecified: Secondary | ICD-10-CM | POA: Diagnosis not present

## 2015-02-11 DIAGNOSIS — Z3402 Encounter for supervision of normal first pregnancy, second trimester: Secondary | ICD-10-CM

## 2015-02-11 DIAGNOSIS — Z3400 Encounter for supervision of normal first pregnancy, unspecified trimester: Secondary | ICD-10-CM | POA: Insufficient documentation

## 2015-02-11 DIAGNOSIS — Z36 Encounter for antenatal screening of mother: Secondary | ICD-10-CM | POA: Diagnosis not present

## 2015-02-11 LAB — POCT URINE PREGNANCY: PREG TEST UR: POSITIVE — AB

## 2015-02-11 NOTE — Progress Notes (Signed)
New OB. Routines reviewed.  See Smart Set Note  Subjective:    Karen Soto is a G1P0000 [redacted]w[redacted]d being seen today for her first obstetrical visit.  Her obstetrical history is significant for Primigravida. Patient does intend to breast feed. Pregnancy history fully reviewed.  Patient reports backache, no bleeding, no cramping and no leaking.  Filed Vitals:   02/11/15 0942  BP: 128/67  Pulse: 79  Weight: 213 lb (96.616 kg)    HISTORY: OB History  Gravida Para Term Preterm AB SAB TAB Ectopic Multiple Living  1 0 0 0 0 0 0 0      # Outcome Date GA Lbr Len/2nd Weight Sex Delivery Anes PTL Lv  1 Current              Past Medical History  Diagnosis Date  . Asthma   . Drooping eyelid 01/04/2012    right  . Headache(784.0) 01/08/2012    "lots since Monday (01/04/2012)"  . Obesity    Past Surgical History  Procedure Laterality Date  . Appendectomy  01/08/2012  . Incision and drainage of wound  ~ 2010    "MRSA in her back; had it drained @ the hospital"  . Laparoscopic appendectomy  01/08/2012    Procedure: APPENDECTOMY LAPAROSCOPIC;  Surgeon: Robyne Askew, MD;  Location: [facility] OR;  Service: General;  Laterality: N/A;  . Appendectomy     Family History  Problem Relation Age of Onset  . Hypertension Mother   . Stroke Mother   . Cancer Father     skin  . Diabetes Paternal Grandmother      Exam    Uterus:  Fundal Height: 15 cm  Pelvic Exam:    Perineum: Done at New OB visit at [facility], deferred today   Vulva: deferred   Vagina:  deferred   pH:    Cervix: deferred   Adnexa: not evaluated   Bony Pelvis: gynecoid  System: Breast:  normal appearance, no masses or tenderness   Skin: normal coloration and turgor, no rashes    Neurologic: oriented, grossly non-focal   Extremities: normal strength, tone, and muscle mass   HEENT neck supple with midline trachea   Mouth/Teeth mucous membranes moist, pharynx normal without lesions   Neck supple and no masses   Cardiovascular: regular rate and rhythm, no murmurs or gallops   Respiratory:  appears well, vitals normal, no respiratory distress, acyanotic, normal RR, ear and throat exam is normal, neck free of mass or lymphadenopathy, chest clear, no wheezing, crepitations, rhonchi, normal symmetric air entry   Abdomen: soft, non-tender; bowel sounds normal; no masses,  no organomegaly   Urinary: urethral meatus normal      Assessment:    Pregnancy: G1P0000 Patient Active Problem List   Diagnosis Date Noted  . Supervision of normal first pregnancy 02/11/2015  . Appendicitis, acute 02/02/2012        Plan:     Initial labs drawn. Prenatal vitamins. Problem list reviewed and updated. Genetic Screening discussed Quad Screen: undecided.  Ultrasound discussed; fetal survey: ordered.  Follow up in 4 weeks. 50% of 30 min visit spent on counseling and coordination of care.   Routines reviewed Wants Waterbirth. Discussed issues around it, including things that might risk her out of it later. Discussed need to take class and buy supplies or arrange service. Discussed research study and need to sign consent.  Undecided about genetic screening Anatomy US scheduled    [facility] 02/11/2015

## 2015-02-11 NOTE — Patient Instructions (Signed)
Second Trimester of Pregnancy The second trimester is from week 13 through week 28, months 4 through 6. The second trimester is often a time when you feel your best. Your body has also adjusted to being pregnant, and you begin to feel better physically. Usually, morning sickness has lessened or quit completely, you may have more energy, and you may have an increase in appetite. The second trimester is also a time when the fetus is growing rapidly. At the end of the sixth month, the fetus is about 9 inches long and weighs about 1 pounds. You will likely begin to feel the baby move (quickening) between 18 and 20 weeks of the pregnancy. BODY CHANGES Your body goes through many changes during pregnancy. The changes vary from woman to woman.   Your weight will continue to increase. You will notice your lower abdomen bulging out.  You may begin to get stretch marks on your hips, abdomen, and breasts.  You may develop headaches that can be relieved by medicines approved by your health care provider.  You may urinate more often because the fetus is pressing on your bladder.  You may develop or continue to have heartburn as a result of your pregnancy.  You may develop constipation because certain hormones are causing the muscles that push waste through your intestines to slow down.  You may develop hemorrhoids or swollen, bulging veins (varicose veins).  You may have back pain because of the weight gain and pregnancy hormones relaxing your joints between the bones in your pelvis and as a result of a shift in weight and the muscles that support your balance.  Your breasts will continue to grow and be tender.  Your gums may bleed and may be sensitive to brushing and flossing.  Dark spots or blotches (chloasma, mask of pregnancy) may develop on your face. This will likely fade after the baby is born.  A dark line from your belly button to the pubic area (linea nigra) may appear. This will likely fade  after the baby is born.  You may have changes in your hair. These can include thickening of your hair, rapid growth, and changes in texture. Some women also have hair loss during or after pregnancy, or hair that feels dry or thin. Your hair will most likely return to normal after your baby is born. WHAT TO EXPECT AT YOUR PRENATAL VISITS During a routine prenatal visit:  You will be weighed to make sure you and the fetus are growing normally.  Your blood pressure will be taken.  Your abdomen will be measured to track your baby's growth.  The fetal heartbeat will be listened to.  Any test results from the previous visit will be discussed. Your health care provider may ask you:  How you are feeling.  If you are feeling the baby move.  If you have had any abnormal symptoms, such as leaking fluid, bleeding, severe headaches, or abdominal cramping.  If you have any questions. Other tests that may be performed during your second trimester include:  Blood tests that check for:  Low iron levels (anemia).  Gestational diabetes (between 24 and 28 weeks).  Rh antibodies.  Urine tests to check for infections, diabetes, or protein in the urine.  An ultrasound to confirm the proper growth and development of the baby.  An amniocentesis to check for possible genetic problems.  Fetal screens for spina bifida and Down syndrome. HOME CARE INSTRUCTIONS   Avoid all smoking, herbs, alcohol, and unprescribed   drugs. These chemicals affect the formation and growth of the baby.  Follow your health care provider's instructions regarding medicine use. There are medicines that are either safe or unsafe to take during pregnancy.  Exercise only as directed by your health care provider. Experiencing uterine cramps is a good sign to stop exercising.  Continue to eat regular, healthy meals.  Wear a good support bra for breast tenderness.  Do not use hot tubs, steam rooms, or saunas.  Wear your  seat belt at all times when driving.  Avoid raw meat, uncooked cheese, cat litter boxes, and soil used by cats. These carry germs that can cause birth defects in the baby.  Take your prenatal vitamins.  Try taking a stool softener (if your health care provider approves) if you develop constipation. Eat more high-fiber foods, such as fresh vegetables or fruit and whole grains. Drink plenty of fluids to keep your urine clear or pale yellow.  Take warm sitz baths to soothe any pain or discomfort caused by hemorrhoids. Use hemorrhoid cream if your health care provider approves.  If you develop varicose veins, wear support hose. Elevate your feet for 15 minutes, 3-4 times a day. Limit salt in your diet.  Avoid heavy lifting, wear low heel shoes, and practice good posture.  Rest with your legs elevated if you have leg cramps or low back pain.  Visit your dentist if you have not gone yet during your pregnancy. Use a soft toothbrush to brush your teeth and be gentle when you floss.  A sexual relationship may be continued unless your health care provider directs you otherwise.  Continue to go to all your prenatal visits as directed by your health care provider. SEEK MEDICAL CARE IF:   You have dizziness.  You have mild pelvic cramps, pelvic pressure, or nagging pain in the abdominal area.  You have persistent nausea, vomiting, or diarrhea.  You have a bad smelling vaginal discharge.  You have pain with urination. SEEK IMMEDIATE MEDICAL CARE IF:   You have a fever.  You are leaking fluid from your vagina.  You have spotting or bleeding from your vagina.  You have severe abdominal cramping or pain.  You have rapid weight gain or loss.  You have shortness of breath with chest pain.  You notice sudden or extreme swelling of your face, hands, ankles, feet, or legs.  You have not felt your baby move in over an hour.  You have severe headaches that do not go away with  medicine.  You have vision changes. Document Released: 05/05/2001 Document Revised: 05/16/2013 Document Reviewed: 07/12/2012 Exit[employer] Patient Information 2015 [employer]. This information is not intended to replace advice given to you by your health care provider. Make sure you discuss any questions you have with your health care provider.  

## 2015-02-12 LAB — CULTURE, URINE COMPREHENSIVE
COLONY COUNT: NO GROWTH
ORGANISM ID, BACTERIA: NO GROWTH

## 2015-02-12 LAB — GC/CHLAMYDIA PROBE AMP
CT PROBE, AMP APTIMA: NEGATIVE
GC PROBE AMP APTIMA: NEGATIVE

## 2015-02-13 DIAGNOSIS — Z3402 Encounter for supervision of normal first pregnancy, second trimester: Secondary | ICD-10-CM

## 2015-03-11 ENCOUNTER — Ambulatory Visit (HOSPITAL_COMMUNITY)
Admission: RE | Admit: 2015-03-11 | Discharge: 2015-03-11 | Disposition: A | Discharge status: Home / Self Care | Payer: Medicaid Other | Source: Ambulatory Visit | Attending: Advanced Practice Midwife | Admitting: Advanced Practice Midwife

## 2015-03-11 DIAGNOSIS — Z36 Encounter for antenatal screening of mother: Secondary | ICD-10-CM | POA: Insufficient documentation

## 2015-03-11 DIAGNOSIS — Z3402 Encounter for supervision of normal first pregnancy, second trimester: Secondary | ICD-10-CM

## 2015-03-11 DIAGNOSIS — N912 Amenorrhea, unspecified: Secondary | ICD-10-CM

## 2015-03-13 ENCOUNTER — Ambulatory Visit (INDEPENDENT_AMBULATORY_CARE_PROVIDER_SITE_OTHER): Payer: Medicaid Other | Admitting: Obstetrics & Gynecology

## 2015-03-13 VITALS — BP 133/69 | HR 80 | Wt 231.0 lb

## 2015-03-13 DIAGNOSIS — Z3402 Encounter for supervision of normal first pregnancy, second trimester: Secondary | ICD-10-CM

## 2015-03-13 NOTE — Addendum Note (Signed)
Addended by: Anell BarrHOWARD, Jacolyn Joaquin L on: 03/13/2015 01:28 PM   Modules accepted: Orders

## 2015-03-13 NOTE — Progress Notes (Signed)
Subjective:  Karen Soto is a 24 y.o. S AA  G1P0000 at 2765w6d being seen today for ongoing prenatal care.  Patient reports no complaints.  Contractions: Not present.  Vag. Bleeding: None. Movement: Present. Denies leaking of fluid.   The following portions of the patient's history were reviewed and updated as appropriate: allergies, current medications, past family history, past medical history, past social history, past surgical history and problem list. Problem list updated.  Objective:   Filed Vitals:   03/13/15 1307  BP: 133/69  Pulse: 80  Weight: 231 lb (104.781 kg)    Fetal Status: Fetal Heart Rate (bpm): 150 Fundal Height: 20 cm Movement: Present     General:  Alert, oriented and cooperative. Patient is in no acute distress.  Skin: Skin is warm and dry. No rash noted.   Cardiovascular: Normal heart rate noted  Respiratory: Normal respiratory effort, no problems with respiration noted  Abdomen: Soft, gravid, appropriate for gestational age. Pain/Pressure: Absent     Pelvic: Vag. Bleeding: None Vag D/C Character: Thin   Cervical exam deferred        Extremities: Normal range of motion.  Edema: None  Mental Status: Normal mood and affect. Normal behavior. Normal judgment and thought content.   Urinalysis: Urine Protein: Negative Urine Glucose: Negative  Assessment and Plan:  Pregnancy: G1P0000 at 7565w6d  1. Encounter for supervision of normal first pregnancy in second trimester  - US MFM OB FOLLOW UP; Future - She plans to take the waterbirth classes  Preterm labor symptoms and general obstetric precautions including but not limited to vaginal bleeding, contractions, leaking of fluid and fetal movement were reviewed in detail with the patient. Please refer to After Visit Summary for other counseling recommendations.  Return in about 4 weeks (around 04/10/2015).   Allie BossierMyra C Alfard Cochrane, MD

## 2015-03-13 NOTE — Progress Notes (Signed)
She declines a flu vaccine.

## 2015-03-14 LAB — HIV ANTIBODY (ROUTINE TESTING W REFLEX): HIV 1&2 Ab, 4th Generation: NONREACTIVE

## 2015-03-14 LAB — SICKLE CELL SCREEN: SICKLE CELL SCREEN: NEGATIVE

## 2015-03-15 LAB — OBSTETRIC PANEL
ANTIBODY SCREEN: NEGATIVE
BASOS PCT: 0 % (ref 0–1)
Basophils Absolute: 0 10*3/uL (ref 0.0–0.1)
EOS ABS: 0.2 10*3/uL (ref 0.0–0.7)
EOS PCT: 2 % (ref 0–5)
HEMATOCRIT: 34.2 % — AB (ref 36.0–46.0)
HEMOGLOBIN: 11.1 g/dL — AB (ref 12.0–15.0)
Hepatitis B Surface Ag: NEGATIVE
Lymphocytes Relative: 18 % (ref 12–46)
Lymphs Abs: 1.7 10*3/uL (ref 0.7–4.0)
MCH: 27 pg (ref 26.0–34.0)
MCHC: 32.5 g/dL (ref 30.0–36.0)
MCV: 83.2 fL (ref 78.0–100.0)
MONOS PCT: 12 % (ref 3–12)
MPV: 10.1 fL (ref 8.6–12.4)
Monocytes Absolute: 1.1 10*3/uL — ABNORMAL HIGH (ref 0.1–1.0)
Neutro Abs: 6.3 10*3/uL (ref 1.7–7.7)
Neutrophils Relative %: 68 % (ref 43–77)
Platelets: 188 10*3/uL (ref 150–400)
RBC: 4.11 MIL/uL (ref 3.87–5.11)
RDW: 14.5 % (ref 11.5–15.5)
RH TYPE: NEGATIVE
Rubella: 1.01 Index — ABNORMAL HIGH (ref <0.90)
WBC: 9.2 10*3/uL (ref 4.0–10.5)

## 2015-03-18 NOTE — Addendum Note (Signed)
Encounter addended by: Aviva SignsMarie L Jalyah Weinheimer, CNM on: 03/18/2015  2:44 PM<BR>     Documentation filed: Problem List

## 2015-04-08 ENCOUNTER — Ambulatory Visit (HOSPITAL_COMMUNITY)
Admission: RE | Admit: 2015-04-08 | Discharge: 2015-04-08 | Disposition: A | Discharge status: Home / Self Care | Payer: Medicaid Other | Source: Ambulatory Visit | Attending: Obstetrics & Gynecology | Admitting: Obstetrics & Gynecology

## 2015-04-08 DIAGNOSIS — Z36 Encounter for antenatal screening of mother: Secondary | ICD-10-CM | POA: Diagnosis not present

## 2015-04-08 DIAGNOSIS — Z3A24 24 weeks gestation of pregnancy: Secondary | ICD-10-CM | POA: Insufficient documentation

## 2015-04-08 DIAGNOSIS — Z3402 Encounter for supervision of normal first pregnancy, second trimester: Secondary | ICD-10-CM

## 2015-04-17 ENCOUNTER — Ambulatory Visit (INDEPENDENT_AMBULATORY_CARE_PROVIDER_SITE_OTHER): Payer: Medicaid Other | Admitting: Obstetrics & Gynecology

## 2015-04-17 VITALS — BP 123/76 | HR 76 | Wt 245.0 lb

## 2015-04-17 DIAGNOSIS — Z3402 Encounter for supervision of normal first pregnancy, second trimester: Secondary | ICD-10-CM

## 2015-04-17 NOTE — Progress Notes (Signed)
Subjective:  Karen Soto is a 24 y.o. G1P0000 (son in utero) at 3230w6d being seen today for ongoing prenatal care.  She is currently monitored for the following issues for this low-risk pregnancy and has Appendicitis, acute and Supervision of normal first pregnancy on her problem list.  Patient reports no complaints.  Contractions: Not present. Vag. Bleeding: None.  Movement: Present. Denies leaking of fluid.   The following portions of the patient's history were reviewed and updated as appropriate: allergies, current medications, past family history, past medical history, past social history, past surgical history and problem list. Problem list updated.  Objective:   Filed Vitals:   04/17/15 1435  BP: 123/76  Pulse: 76  Weight: 245 lb (111.131 kg)    Fetal Status: Fetal Heart Rate (bpm): 143   Movement: Present     General:  Alert, oriented and cooperative. Patient is in no acute distress.  Skin: Skin is warm and dry. No rash noted.   Cardiovascular: Normal heart rate noted  Respiratory: Normal respiratory effort, no problems with respiration noted  Abdomen: Soft, gravid, appropriate for gestational age. Pain/Pressure: Absent     Pelvic: Vag. Bleeding: None Vag D/C Character: Thin   Cervical exam deferred        Extremities: Normal range of motion.  Edema: None  Mental Status: Normal mood and affect. Normal behavior. Normal judgment and thought content.   Urinalysis:      Assessment and Plan:  Pregnancy: G1P0000 at 8730w6d  1. Encounter for supervision of normal first pregnancy in second trimester She will be taking the waterbirth class 04/30/16.  Preterm labor symptoms and general obstetric precautions including but not limited to vaginal bleeding, contractions, leaking of fluid and fetal movement were reviewed in detail with the patient. Please refer to After Visit Summary for other counseling recommendations.  Return in about 4 weeks (around 05/15/2015) for glucola.   Allie BossierMyra  C Zenita Kister, MD

## 2015-04-17 NOTE — Progress Notes (Signed)
Patient has signed up for water birthing class on Dec 7th.Karen Soto. Karen Soto

## 2015-05-14 ENCOUNTER — Telehealth: Payer: Self-pay

## 2015-05-14 NOTE — Telephone Encounter (Signed)
Attempted to reach patient again to reschedule her 3 hr gtt appointment she never came to and [facility]. Unable to reach patient and unable to leave message because voicemailbox not set up. Armandina StammerJennifer Ramya Vanbergen RN BSN

## 2015-05-15 ENCOUNTER — Encounter: Payer: Self-pay | Admitting: Obstetrics & Gynecology

## 2015-05-15 ENCOUNTER — Ambulatory Visit (INDEPENDENT_AMBULATORY_CARE_PROVIDER_SITE_OTHER): Payer: Medicaid Other | Admitting: Obstetrics & Gynecology

## 2015-05-15 VITALS — BP 134/89 | HR 82 | Wt 254.0 lb

## 2015-05-15 DIAGNOSIS — O360930 Maternal care for other rhesus isoimmunization, third trimester, not applicable or unspecified: Secondary | ICD-10-CM | POA: Diagnosis not present

## 2015-05-15 DIAGNOSIS — N898 Other specified noninflammatory disorders of vagina: Secondary | ICD-10-CM

## 2015-05-15 DIAGNOSIS — Z23 Encounter for immunization: Secondary | ICD-10-CM | POA: Diagnosis not present

## 2015-05-15 DIAGNOSIS — E669 Obesity, unspecified: Secondary | ICD-10-CM

## 2015-05-15 DIAGNOSIS — O99210 Obesity complicating pregnancy, unspecified trimester: Secondary | ICD-10-CM

## 2015-05-15 DIAGNOSIS — Z3402 Encounter for supervision of normal first pregnancy, second trimester: Secondary | ICD-10-CM

## 2015-05-15 MED ORDER — RHO D IMMUNE GLOBULIN 1500 UNIT/2ML IJ SOSY
300.0000 ug | PREFILLED_SYRINGE | Freq: Once | INTRAMUSCULAR | Status: AC
  Administered 2015-05-15: 300 ug via INTRAMUSCULAR

## 2015-05-15 MED ORDER — METRONIDAZOLE 500 MG PO TABS: 500.0000 mg | ORAL_TABLET | Freq: Two times a day (BID) | ORAL | Status: DC

## 2015-05-15 NOTE — Progress Notes (Signed)
Subjective:  Karen Soto is a 24 y.o. S AA G1P0000 at 6057w6d being seen today for ongoing prenatal care.  She is currently monitored for the following issues for this low-risk pregnancy and has Appendicitis, acute; Supervision of normal first pregnancy; and Obesity in pregnancy, antepartum on her problem list.  Patient reports no complaints except a vaginal discharge.  Contractions: Not present. Vag. Bleeding: None.  Movement: Present. Denies leaking of fluid.   The following portions of the patient's history were reviewed and updated as appropriate: allergies, current medications, past family history, past medical history, past social history, past surgical history and problem list. Problem list updated.  Objective:   Filed Vitals:   05/15/15 1320  BP: 149/99  Pulse: 99  Weight: 254 lb (115.214 kg)    Fetal Status: Fetal Heart Rate (bpm): 154   Movement: Present     General:  Alert, oriented and cooperative. Patient is in no acute distress.  Skin: Skin is warm and dry. No rash noted.   Cardiovascular: Normal heart rate noted  Respiratory: Normal respiratory effort, no problems with respiration noted  Abdomen: Soft, gravid, appropriate for gestational age. Pain/Pressure: Absent     Pelvic: Vag. Bleeding: None Vag D/C Character: Thin   Cervical exam deferred        Extremities: Normal range of motion.  Edema: None  Mental Status: Normal mood and affect. Normal behavior. Normal judgment and thought content.   Urinalysis: Urine Protein: Negative Urine Glucose: Negative Speculum exam- frothy discharge noted Assessment and Plan:  Pregnancy: G1P0000 at 6357w6d  1. Encounter for supervision of normal first pregnancy in second trimester  - Glucose Tolerance, 1 HR (50g) - RPR - HIV antibody (with reflex) - CBC - Rhophylac today - TDAP 2. Obesity in pregnancy, antepartum, unspecified trimester  3. Vaginal discharge- c/w BV, treat with flagyl, wet prep sent   Preterm labor symptoms  and general obstetric precautions including but not limited to vaginal bleeding, contractions, leaking of fluid and fetal movement were reviewed in detail with the patient. Please refer to After Visit Summary for other counseling recommendations.  No Follow-up on file.   Allie BossierMyra C London Nonaka, MD

## 2015-05-16 LAB — CBC
HEMATOCRIT: 34.7 % — AB (ref 36.0–46.0)
HEMOGLOBIN: 11.3 g/dL — AB (ref 12.0–15.0)
MCH: 26.5 pg (ref 26.0–34.0)
MCHC: 32.6 g/dL (ref 30.0–36.0)
MCV: 81.5 fL (ref 78.0–100.0)
MPV: 10.1 fL (ref 8.6–12.4)
Platelets: 236 10*3/uL (ref 150–400)
RBC: 4.26 MIL/uL (ref 3.87–5.11)
RDW: 13.2 % (ref 11.5–15.5)
WBC: 10.5 10*3/uL (ref 4.0–10.5)

## 2015-05-17 ENCOUNTER — Other Ambulatory Visit: Payer: Self-pay

## 2015-05-17 ENCOUNTER — Encounter: Payer: Self-pay | Admitting: Obstetrics & Gynecology

## 2015-05-17 DIAGNOSIS — O99810 Abnormal glucose complicating pregnancy: Secondary | ICD-10-CM | POA: Insufficient documentation

## 2015-05-17 LAB — GLUCOSE TOLERANCE, 1 HOUR (50G) W/O FASTING: Glucose, 1 Hour GTT: 143 mg/dL — ABNORMAL HIGH (ref 70–140)

## 2015-05-17 LAB — RPR

## 2015-05-17 LAB — HIV ANTIBODY (ROUTINE TESTING W REFLEX): HIV 1&2 Ab, 4th Generation: NONREACTIVE

## 2015-05-17 NOTE — Addendum Note (Signed)
Addended by: Anell BarrHOWARD, JENNIFER L on: 05/17/2015 12:35 PM   Modules accepted: Orders

## 2015-05-20 ENCOUNTER — Encounter (HOSPITAL_BASED_OUTPATIENT_CLINIC_OR_DEPARTMENT_OTHER): Payer: Self-pay | Admitting: *Deleted

## 2015-05-20 ENCOUNTER — Emergency Department (HOSPITAL_BASED_OUTPATIENT_CLINIC_OR_DEPARTMENT_OTHER)
Admission: EM | Admit: 2015-05-20 | Discharge: 2015-05-20 | Disposition: A | Discharge status: Home / Self Care | Payer: Medicaid Other | Attending: Emergency Medicine | Admitting: Emergency Medicine

## 2015-05-20 DIAGNOSIS — H6693 Otitis media, unspecified, bilateral: Secondary | ICD-10-CM

## 2015-05-20 DIAGNOSIS — H6593 Unspecified nonsuppurative otitis media, bilateral: Secondary | ICD-10-CM | POA: Diagnosis not present

## 2015-05-20 DIAGNOSIS — E669 Obesity, unspecified: Secondary | ICD-10-CM | POA: Insufficient documentation

## 2015-05-20 DIAGNOSIS — O99213 Obesity complicating pregnancy, third trimester: Secondary | ICD-10-CM | POA: Diagnosis not present

## 2015-05-20 DIAGNOSIS — Z79899 Other long term (current) drug therapy: Secondary | ICD-10-CM | POA: Insufficient documentation

## 2015-05-20 DIAGNOSIS — Z3A30 30 weeks gestation of pregnancy: Secondary | ICD-10-CM | POA: Diagnosis not present

## 2015-05-20 DIAGNOSIS — O99513 Diseases of the respiratory system complicating pregnancy, third trimester: Secondary | ICD-10-CM | POA: Diagnosis not present

## 2015-05-20 DIAGNOSIS — O9989 Other specified diseases and conditions complicating pregnancy, childbirth and the puerperium: Secondary | ICD-10-CM | POA: Diagnosis not present

## 2015-05-20 DIAGNOSIS — J45909 Unspecified asthma, uncomplicated: Secondary | ICD-10-CM | POA: Diagnosis not present

## 2015-05-20 MED ORDER — AMOXICILLIN 500 MG PO CAPS: 500.0000 mg | ORAL_CAPSULE | Freq: Two times a day (BID) | ORAL | Status: DC

## 2015-05-20 NOTE — Discharge Instructions (Signed)
Take your medications as prescribed. I also recommend using a warm mist humidifier or steam shower at home for symptomatic relief. Please follow up with a primary care provider from the Resource Guide provided below in 3 days. Please return to the Emergency Department if symptoms worsen or new onset of fever, headache, visual changes, ear drainage, hearing loss, lightheadedness, dizziness, neck pain, difficulty breathing, chest pain.    Emergency Department Resource Guide 1) Find a Doctor and Pay Out of Pocket Although you won't have to find out who is covered by your insurance plan, it is a good idea to ask around and get recommendations. You will then need to call the office and see if the doctor you have chosen will accept you as a new patient and what types of options they offer for patients who are self-pay. Some doctors offer discounts or will set up payment plans for their patients who do not have insurance, but you will need to ask so you aren't surprised when you get to your appointment.  2) Contact Your Local Health Department Not all health departments have doctors that can see patients for sick visits, but many do, so it is worth a call to see if yours does. If you don't know where your local health department is, you can check in your phone book. The CDC also has a tool to help you locate your state's health department, and many state websites also have listings of all of their local health departments.  3) Find a Walk-in Clinic If your illness is not likely to be very severe or complicated, you may want to try a walk in clinic. These are popping up all over the country in pharmacies, drugstores, and shopping centers. They're usually staffed by nurse practitioners or physician assistants that have been trained to treat common illnesses and complaints. They're usually fairly quick and inexpensive. However, if you have serious medical issues or chronic medical problems, these are probably not  your best option.  No Primary Care Doctor: - Call Health Connect at  [phone] - they can help you locate a primary care doctor that  accepts your insurance, provides certain services, etc. - Physician Referral Service- [phone]  Chronic Pain Problems: Organization         Address  Phone   Notes  [facility]  [phone] Patients need to be referred by their primary care doctor.   Medication Assistance: Organization         Address  Phone   Notes  [facility] [address] [place], [place] [address] [phone] --Must be a resident of [place] -- Must have NO insurance coverage whatsoever (no Medicaid/ Medicare, etc.) -- The pt. MUST have a primary care doctor that directs their care regularly and follows them in the community   MedAssist  [phone]   [employer]  [phone]    Agencies that provide inexpensive medical care: Organization         Address  Phone   Notes  [facility]  [phone]   [facility]    [phone]   [facility] [address] [place], [place] [address] [phone]   [facility] [place] [address] [place], [place] [phone]   [facility]anned [facility]    [phone]) [phone]   [facility]    [phone]   [facility]  [address], [place] Phone:  [phone], Fax:  [phone] Hours of Operation:  9 am - 6 pm, M-F.  Also accepts Medicaid/Medicare and self-pay.  [facility] for [place], [address], [place] Phone: [phone], Fax: [phone]. Hours of Operation:  8:30 am - 5:30 pm, M-F.  Also accepts Medicaid and self-pay.  [facility] [address], [place] Phone: [phone]   [place], [place], [place] [phone], Ext. [phone] Mondays & Thursdays: 7-9 AM.  First  15 patients are seen on a first come, first serve basis.    [place] Providers:  Organization         Address  Phone   Notes  [facility] [address],  [phone] Also accepts self-pay patients.  [facility] [id number] [place], [place]  [phone]   [place], [address], [place] [phone]   [facility] [address] [place] [phone]   Lucianne Lei [address], [place]   [phone] Only accepts [place] Access [place] patients after they have their name applied to their card.   Self-Pay (no insurance) in [facility]:  Organization         Address  Phone   Notes  Sickle Cell Patients, [facility] [place] [phone]   [facility] [place] [phone]   [facility] [place] [place]  [place], [place], [place] [phone]   [facility]/Dr. Osei-Bonsu  [address] [place] or [address], [place] [phone] Phone number for both [place] and [place] locations is the same.  [facility] [address], [place] [phone]   [facility] [address], [place] or [address] [phone]   [facility] [address] [place] [phone], phone; [phone], fax Sees patients 1st and 3rd Saturday of every month.  Must not qualify for public or private insurance (i.e. Medicaid, Medicare, [place] Health Choice, Veterans' Benefits)  Household income should be no more than 200% of the poverty level The clinic cannot treat you if you are pregnant or think you are pregnant  Sexually transmitted diseases are not treated at the clinic.    Dental  Care: Organization         Address  Phone  Notes  [place] [facility] [phone] Accepts children up to age 90 who are enrolled in [place] or [place]; pregnant women with a Medicaid card; and children who have applied for Medicaid or [place] Health Choice, but were declined, whose parents can pay a reduced fee at time of service.  [facility] Department of [facility]  [address], [place] [phone] Accepts children up to age 43 who are enrolled in [place] or [place]; pregnant women with a Medicaid card; and children who have applied for Medicaid or [place] Health Choice, but were declined, whose parents can pay a reduced fee at time of service.  [facility] Adult Dental Access PROGRAM  [place], [place] [phone]  Patients are seen by appointment only. Walk-ins are not accepted. [place] will see patients 1 years of age and older. Monday - Tuesday (8am-5pm) Most Wednesdays (8:30-5pm) $30 per visit, cash only  [facility] Adult Dental Access PROGRAM  [address], [facility] [phone] Patients are seen by appointment only. Walk-ins are not accepted. [place] will see patients 28 years of age and older. One Wednesday Evening (Monthly: Volunteer Based).  $30 per visit, cash only  [place]  [phone] for adults; Children under age 81, call Graduate Pediatric Dentistry at [phone]. Children aged 82-14, please call [phone] to request a pediatric application.  Dental services are provided in all areas of dental care including fillings, crowns and bridges, complete and partial dentures, implants, gum treatment, root canals, and extractions. Preventive care is also provided. Treatment is provided to both adults and children. Patients are selected via a lottery and there is often a waiting list.   [facility] [address] [place]  [phone] [email or web address]   [facility] [address] [place], [place] [phone], Ext. [phone] Second and Fourth Thursday of each month, opens at 6:30 AM; Clinic ends at 9 AM.  Patients are seen on a first-come first-served basis, and a limited number are seen during each clinic.   [facility]  [address] Hillard Danker [place], [place] [phone]   Eligibility Requirements You must have lived in [place], [place], or [place] counties for at least the last three months.   You cannot be eligible for state or federal sponsored [employer], including [employer], [place], or [employer].   You generally cannot be eligible for healthcare insurance through your employer.    How to apply: Eligibility screenings are held every Tuesday and Wednesday afternoon from 1:00 pm until 4:00 pm. You do not need an appointment for the interview!  [facility] [address] [place], [place]   [place]  [place] Department  [place]  [phone]    Behavioral Health Resources in the Community: Intensive Outpatient Programs Organization         Address  Phone  Notes  [place]. [address], [place], [place] [phone]   [facility] Outpatient [address] [place], [place]   ADS: Alcohol & Drug Svcs [address], [place], [place]   [place] [address],  [place], [place] or [phone]   Substance Abuse Resources Organization         Address  Phone  Notes  Alcohol and Drug Services  [phone]   [place]  [phone]   The [place]   Chinita Pester  [phone]   Residential & Outpatient Substance Abuse Program  [phone]    Psychological Services Organization         Address  Phone  Notes  [facility] [place]  [place]  [phone]   [place] [address], [place] or [phone]    Mobile Crisis Teams Organization         Address  Phone  Notes  [facility], Mobile Crisis Care Unit  [phone]   [facility]  [address] [employer], [place]   [facility] [address] [place]  [place] [phone]    Self-Help/Support Groups Organization         Address  Phone             Notes  [facility] of [place] - variety of support groups  [place]itton Call for more information  Narcotics Anonymous (NA), [facility] [address], [employer] [place]  2 meetings at this location   Special educational needs teacher         Address  Phone  Notes  [facility] Residential Treatment [place],    [place]  [phone]   [facility]  [address] [place] [phone], [place], [place]   [place], [place] [phone] Admissions: 8am-3pm M-F  Incentives Substance [place] [address],    [place], [place] [phone]   [facility] [address] [place], [place], [place]   [facility] [address],  [place], [place]   [facility] Programs - Intensive Outpatient [address], [place], [place]   [facility] [place].,  [place], [place] [phone] or [phone]   [facility] (RTS) [address] [place], [place] Accepts Medicaid  [facility] [address],  [place] [phone] Substance Abuse/Addiction Treatment   [facility] Organization         Address  Phone  Notes  [facility]  [phone]   Domenic Schwab, PhD [address] Arlis [place], [place]   [phone] or [phone]   [place], [place] [phone]   [facility] [address], [place], [place] [phone] Insurance/Medicaid/sponsorship through [facility] and Families [address] [place], [address] [place]                                    [employer], [place] [phone] [place] [address], [place] [phone]    Dr. Adele Schilder  [phone]   [facility] Dept. 1) [address], [place] 2) [place] 3)  [place] [address], [place] [phone]  [phone]   [place] [phone] or [phone] (After Hours)

## 2015-05-20 NOTE — ED Provider Notes (Signed)
CSN: [id number]     Arrival date & time 05/20/15  1300 History   First MD Initiated Contact with Patient 05/20/15 1601     Chief Complaint  Patient presents with  . Tinnitus     (Consider location/radiation/quality/duration/timing/severity/associated sxs/prior Treatment) HPI   Patient is 24 year old female with no past medical history who is [redacted] weeks pregnant who presents to the ED with complaint of bilateral tinnitus, onset 2 weeks. Patient reports proximal month ago she began having nasal congestion/rhinorrhea. She notes her congestion has started to improve but states she started having ringing in both of her ears. She also endorses muffled hearing in both ears and states she started having pain in her right ear this week. Denies fever, chills, visual changes, ear drainage, sore throat, neck pain, cough, difficulty breathing, chest pain, abdominal pain, nausea, vomiting, diarrhea. Patient denies taking any medications at home starting any new medications recently. She states so far she is not had any, medications with her pregnancy. She notes she has a follow-up appointment with her OB scheduled for January 4.  Past Medical History  Diagnosis Date  . Asthma   . Drooping eyelid 01/04/2012    right  . Headache(784.0) 01/08/2012    "lots since Monday (01/04/2012)"  . Obesity    Past Surgical History  Procedure Laterality Date  . Appendectomy  01/08/2012  . Incision and drainage of wound  ~ 2010    "MRSA in her back; had it drained @ the hospital"  . Laparoscopic appendectomy  01/08/2012    Procedure: APPENDECTOMY LAPAROSCOPIC;  Surgeon: Robyne AskewPaul S Toth III, MD;  [facility];  Service: General;  Laterality: N/A;  . Appendectomy     Family History  Problem Relation Age of Onset  . Hypertension Mother   . Stroke Mother   . Cancer Father     skin  . Diabetes Paternal Grandmother    Social History  Substance Use Topics  . Smoking status: Never Smoker   . Smokeless tobacco: None  .  Alcohol Use: No     Comment: occ   OB History    Gravida Para Term Preterm AB TAB SAB Ectopic Multiple Living   1 0 0 0 0 0 0 0       Review of Systems  HENT: Positive for congestion, ear pain and rhinorrhea.        Muffled hearing  Neurological: Positive for headaches.  All other systems reviewed and are negative.     Allergies  Review of patient's allergies indicates no known allergies.  Home Medications   Prior to Admission medications   Medication Sig Start Date End Date Taking? Authorizing Provider  amoxicillin (AMOXIL) 500 MG capsule Take 1 capsule (500 mg total) by mouth 2 (two) times daily. 05/20/15   Barrett HenleNicole Elizabeth Nadeau, PA-C  Pediatric Multiple Vit-C-FA (FLINSTONES GUMMIES OMEGA-3 DHA PO) Take 1 tablet by mouth 2 (two) times daily.    Historical Provider, MD   BP 142/82 mmHg  Pulse 78  Temp(Src) 98 F (36.7 C)  Resp 16  Wt 115.214 kg  SpO2 100%  LMP 10/25/2014 (Approximate) Physical Exam  Constitutional: She is oriented to person, place, and time. She appears well-developed and well-nourished. No distress.  HENT:  Head: Normocephalic and atraumatic.  Right Ear: Tympanic membrane is erythematous. A middle ear effusion is present.  Left Ear: Tympanic membrane is erythematous. A middle ear effusion is present.  Nose: Nose normal. Right sinus exhibits no maxillary sinus tenderness and no  frontal sinus tenderness. Left sinus exhibits no maxillary sinus tenderness and no frontal sinus tenderness.  Mouth/Throat: Uvula is midline, oropharynx is clear and moist and mucous membranes are normal. No oropharyngeal exudate.  Eyes: Conjunctivae and EOM are normal. Pupils are equal, round, and reactive to light. Right eye exhibits no discharge. Left eye exhibits no discharge. No scleral icterus.  Neck: Normal range of motion. Neck supple.  Cardiovascular: Normal rate, regular rhythm, normal heart sounds and intact distal pulses.   Pulmonary/Chest: Effort normal and breath  sounds normal. No respiratory distress. She has no wheezes. She has no rales. She exhibits no tenderness.  Abdominal: Soft. Bowel sounds are normal. She exhibits no distension and no mass. There is no tenderness. There is no rebound and no guarding.  Musculoskeletal: Normal range of motion. She exhibits no edema.  Lymphadenopathy:    She has no cervical adenopathy.  Neurological: She is alert and oriented to person, place, and time. No cranial nerve deficit. Coordination normal.  Skin: Skin is warm and dry.  Nursing note and vitals reviewed.   ED Course  Procedures (including critical care time) Labs Review Labs Reviewed - No data to display  Imaging Review No results found. I have personally reviewed and evaluated these images and lab results as part of my medical decision-making.  Filed Vitals:   05/20/15 1319  BP: 142/82  Pulse: 78  Temp: 98 F (36.7 C)  Resp: 16     MDM   Final diagnoses:  Bilateral acute otitis media, recurrence not specified, unspecified otitis media type   patient presents with congestion, rhinorrhea, tinnitus and muffled hearing. She is [redacted] weeks pregnant, denies any, location strain her current pregnancy. VSS. Exam revealed bilateral erythematous TMs, hearing grossly intact, remaining exam unremarkable. No neuro deficits. Patient's presentation consistent with otitis media. Plan she patient with antibiotics and discharged home. Advised patient to follow up with her primary care provider.  Evaluation does not show pathology requring ongoing emergent intervention or admission. Pt is hemodynamically stable and mentating appropriately. Discussed findings/results and plan with patient/guardian, who agrees with plan. All questions answered. Return precautions discussed and outpatient follow up given.      Satira Sark [place], [place] 05/21/15 5409  Loren Racer, MD 05/25/15 [phone]

## 2015-05-20 NOTE — ED Notes (Addendum)
Pt c/o ears ringing x 1 month 30 weeks preg

## 2015-05-21 ENCOUNTER — Telehealth: Payer: Self-pay | Admitting: *Deleted

## 2015-05-21 NOTE — Telephone Encounter (Signed)
LM on voicemail concerning 28 week labs.  She is instructed to call the office to schedule a fasting 3 hr GTT sometime next week.  All other labs were WNL.

## 2015-05-22 ENCOUNTER — Telehealth: Payer: Self-pay | Admitting: *Deleted

## 2015-05-22 DIAGNOSIS — B9689 Other specified bacterial agents as the cause of diseases classified elsewhere: Secondary | ICD-10-CM

## 2015-05-22 DIAGNOSIS — N760 Acute vaginitis: Principal | ICD-10-CM

## 2015-05-22 LAB — WET PREP BY MOLECULAR PROBE
Candida species: NEGATIVE
Gardnerella vaginalis: POSITIVE — AB
Trichomonas vaginosis: NEGATIVE

## 2015-05-22 MED ORDER — METRONIDAZOLE 500 MG PO TABS: 500.0000 mg | ORAL_TABLET | Freq: Two times a day (BID) | ORAL | Status: DC

## 2015-05-22 NOTE — Telephone Encounter (Signed)
LM on voicemail of positive BV and Flag[facility] was [facility] per protocol,  Reminded patient again that she needs to call [facility] office to schedule her 3 hr GTT.

## 2015-05-23 ENCOUNTER — Telehealth: Payer: Self-pay | Admitting: *Deleted

## 2015-05-23 DIAGNOSIS — Z3402 Encounter for supervision of normal first pregnancy, second trimester: Secondary | ICD-10-CM

## 2015-05-23 LAB — GLUCOSE TOLERANCE, 3 HOURS
GLUCOSE, 1 HOUR-GESTATIONAL: 96 mg/dL (ref 70–189)
GLUCOSE, 2 HOUR-GESTATIONAL: 91 mg/dL (ref 70–164)
Glucose Tolerance, Fasting: 71 mg/dL (ref 65–99)
Glucose, GTT - 3 Hour: 74 mg/dL (ref 70–144)

## 2015-05-23 NOTE — Telephone Encounter (Signed)
Pt notified of normal 3 hour GTT.

## 2015-05-26 NOTE — L&D Delivery Note (Signed)
Delivery Note At 10:03 PM a viable and healthy female was delivered via SVD  (Presentation: ROA).  APGAR:8;9.   Placenta status: Intact, Spontaneous.  Cord:  without the following complications.  Anesthesia: Epidural  Episiotomy:  None  Lacerations:  None Suture Repair: None Est. Blood Loss (mL): 50 mL    Mom to postpartum.  Baby to Couplet care / Skin to Skin.  Riley LamChardena Molner is a 25 y.o. female G1P0000 with IUP at 5960w2d admitted for PROM.  She progressed with augmentation to complete and pushed <30 minutes to deliver.  Cord clamping delayed by several minutes then clamped by Medical Student and cut by FOB.  Placenta intact and spontaneous, bleeding minimal.  No laceration repaired without difficulty.  Mom and baby stable prior to transfer to postpartum.    Michael J [place] 08/10/2015, 10:19 PM   Patient is a G1 at 7060w2d who was admitted w/ PROM, uncomplicated prenatal course.  She progressed with augmentation via Pitocin.  I was gloved and present for delivery in its entirety.  Second stage of labor progressed to SVD;  Mild-mod fetal decels during second stage noted.  Complications: none  Lacerations: none  EBL: 50cc  Sheddrick Lattanzio, CNM 11:05 PM  08/10/2015

## 2015-05-29 ENCOUNTER — Ambulatory Visit (INDEPENDENT_AMBULATORY_CARE_PROVIDER_SITE_OTHER): Payer: Medicaid Other | Admitting: Family Medicine

## 2015-05-29 VITALS — BP 126/76 | HR 97 | Wt 252.0 lb

## 2015-05-29 DIAGNOSIS — O99810 Abnormal glucose complicating pregnancy: Secondary | ICD-10-CM

## 2015-05-29 DIAGNOSIS — Z3402 Encounter for supervision of normal first pregnancy, second trimester: Secondary | ICD-10-CM

## 2015-05-29 NOTE — Telephone Encounter (Signed)
Erroneous

## 2015-05-29 NOTE — Progress Notes (Signed)
Subjective:  Karen Soto is a 25 y.o. G1P0000 at [redacted]w[redacted]d being seen today for ongoing prenatal care.  She is currently monitored for the following issues for this low-risk pregnancy and has Supervision of normal first pregnancy; Obesity in pregnancy, antepartum; and Abnormal maternal glucose tolerance, antepartum on her problem list.  Patient reports no complaints.  Contractions: Irregular. Vag. Bleeding: None.  Movement: Present. Denies leaking of fluid.   The following portions of the patient's history were reviewed and updated as appropriate: allergies, current medications, past family history, past medical history, past social history, past surgical history and problem list. Problem list updated.  Objective:   Filed Vitals:   05/29/15 1521  BP: 126/76  Pulse: 97  Weight: 252 lb (114.306 kg)    Fetal Status: Fetal Heart Rate (bpm): 158 Fundal Height: 31 cm Movement: Present     General:  Alert, oriented and cooperative. Patient is in no acute distress.  Skin: Skin is warm and dry. No rash noted.   Cardiovascular: Normal heart rate noted  Respiratory: Normal respiratory effort, no problems with respiration noted  Abdomen: Soft, gravid, appropriate for gestational age. Pain/Pressure: Present     Pelvic: Vag. Bleeding: None Vag D/C Character: Thin   Cervical exam deferred        Extremities: Normal range of motion.  Edema: None  Mental Status: Normal mood and affect. Normal behavior. Normal judgment and thought content.   Urinalysis: Urine Protein: Negative Urine Glucose: Negative  Assessment and Plan:  Pregnancy: G1P0000 at [redacted]w[redacted]d  1. Encounter for supervision of normal first pregnancy in second trimester FHT normal, FH normal.    2. Abnormal maternal glucose tolerance, antepartum 3hr GTT normal.  Preterm labor symptoms and general obstetric precautions including but not limited to vaginal bleeding, contractions, leaking of fluid and fetal movement were reviewed in detail with  the patient. Please refer to After Visit Summary for other counseling recommendations.  Return in about 2 weeks (around 06/12/2015).   Levie HeritageJacob J Janette Harvie, DO

## 2015-05-29 NOTE — Patient Instructions (Signed)

## 2015-06-12 ENCOUNTER — Ambulatory Visit (INDEPENDENT_AMBULATORY_CARE_PROVIDER_SITE_OTHER): Payer: Medicaid Other | Admitting: Family Medicine

## 2015-06-12 VITALS — BP 122/74 | HR 73 | Wt 257.0 lb

## 2015-06-12 DIAGNOSIS — Z3403 Encounter for supervision of normal first pregnancy, third trimester: Secondary | ICD-10-CM

## 2015-06-12 NOTE — Progress Notes (Signed)
Subjective:  Karen Soto is a 25 y.o. G1P0000 at [redacted]w[redacted]d being seen today for ongoing prenatal care.  She is currently monitored for the following issues for this low-risk pregnancy and has Supervision of normal first pregnancy; Obesity in pregnancy, antepartum; and Abnormal maternal glucose tolerance, antepartum on her problem list.  Patient reports no complaints.  Contractions: Irritability. Vag. Bleeding: None.  Movement: Present. Denies leaking of fluid.   The following portions of the patient's history were reviewed and updated as appropriate: allergies, current medications, past family history, past medical history, past social history, past surgical history and problem list. Problem list updated.  Objective:   Filed Vitals:   06/12/15 1526  BP: 122/74  Pulse: 73  Weight: 257 lb (116.574 kg)    Fetal Status: Fetal Heart Rate (bpm): 145   Movement: Present     General:  Alert, oriented and cooperative. Patient is in no acute distress.  Skin: Skin is warm and dry. No rash noted.   Cardiovascular: Normal heart rate noted  Respiratory: Normal respiratory effort, no problems with respiration noted  Abdomen: Soft, gravid, appropriate for gestational age. Pain/Pressure: Present     Pelvic: Vag. Bleeding: None Vag D/C Character: Thin   Cervical exam deferred        Extremities: Normal range of motion.  Edema: None  Mental Status: Normal mood and affect. Normal behavior. Normal judgment and thought content.   Urinalysis: Urine Protein: Negative Urine Glucose: Negative  Assessment and Plan:  Pregnancy: G1P0000 at [redacted]w[redacted]d  1. Encounter for supervision of normal first pregnancy in third trimester FHT, FH normal.     There are no diagnoses linked to this encounter. Preterm labor symptoms and general obstetric precautions including but not limited to vaginal bleeding, contractions, leaking of fluid and fetal movement were reviewed in detail with the patient. Please refer to After Visit  Summary for other counseling recommendations.  Return in about 2 weeks (around 06/26/2015).   Levie Heritage, DO

## 2015-06-26 ENCOUNTER — Ambulatory Visit (INDEPENDENT_AMBULATORY_CARE_PROVIDER_SITE_OTHER): Payer: Medicaid Other | Admitting: Family Medicine

## 2015-06-26 VITALS — BP 117/68 | HR 90 | Wt 260.0 lb

## 2015-06-26 DIAGNOSIS — Z3403 Encounter for supervision of normal first pregnancy, third trimester: Secondary | ICD-10-CM

## 2015-06-26 NOTE — Progress Notes (Signed)
Subjective:  Karen Soto is a 25 y.o. G1P0000 at [redacted]w[redacted]d being seen today for ongoing prenatal care.  She is currently monitored for the following issues for this low-risk pregnancy and has Supervision of normal first pregnancy; Obesity in pregnancy, antepartum; and Abnormal maternal glucose tolerance, antepartum on her problem list.  Patient reports no complaints - a few braxton-hicks contractions.  Contractions: Not present. Vag. Bleeding: None.  Movement: Present. Denies leaking of fluid.   The following portions of the patient's history were reviewed and updated as appropriate: allergies, current medications, past family history, past medical history, past social history, past surgical history and problem list. Problem list updated.  Objective:   Filed Vitals:   06/26/15 1338  BP: 117/68  Pulse: 90  Weight: 260 lb (117.935 kg)    Fetal Status: Fetal Heart Rate (bpm): 144   Movement: Present     General:  Alert, oriented and cooperative. Patient is in no acute distress.  Skin: Skin is warm and dry. No rash noted.   Cardiovascular: Normal heart rate noted  Respiratory: Normal respiratory effort, no problems with respiration noted  Abdomen: Soft, gravid, appropriate for gestational age. Pain/Pressure: Absent     Pelvic: Vag. Bleeding: None Vag D/C Character: Thin   Cervical exam deferred        Extremities: Normal range of motion.  Edema: None  Mental Status: Normal mood and affect. Normal behavior. Normal judgment and thought content.   Urinalysis: Urine Protein: Negative Urine Glucose: Negative  Assessment and Plan:  Pregnancy: G1P0000 at [redacted]w[redacted]d  1. Encounter for supervision of normal first pregnancy in third trimester FH, FHT normal.  BP normal.  Patient without questions.    Preterm labor symptoms and general obstetric precautions including but not limited to vaginal bleeding, contractions, leaking of fluid and fetal movement were reviewed in detail with the patient. Please  refer to After Visit Summary for other counseling recommendations.  Return in about 1 week (around 07/03/2015) for in [place] to meet midwife for waterbirth.   Levie Heritage, DO

## 2015-06-26 NOTE — Patient Instructions (Signed)

## 2015-07-01 ENCOUNTER — Telehealth: Payer: Self-pay | Admitting: *Deleted

## 2015-07-01 ENCOUNTER — Encounter: Payer: Self-pay | Admitting: *Deleted

## 2015-07-01 ENCOUNTER — Ambulatory Visit (INDEPENDENT_AMBULATORY_CARE_PROVIDER_SITE_OTHER): Payer: Medicaid Other | Admitting: Advanced Practice Midwife

## 2015-07-01 VITALS — BP 114/73 | HR 81 | Wt 267.0 lb

## 2015-07-01 DIAGNOSIS — Z3403 Encounter for supervision of normal first pregnancy, third trimester: Secondary | ICD-10-CM

## 2015-07-01 DIAGNOSIS — O99810 Abnormal glucose complicating pregnancy: Secondary | ICD-10-CM

## 2015-07-01 NOTE — Progress Notes (Signed)
Subjective:  Karen Soto is a 25 y.o. G1P0000 at [redacted]w[redacted]d being seen today for ongoing prenatal care.  She is currently monitored for the following issues for this low-risk pregnancy and has Supervision of normal first pregnancy; Obesity in pregnancy, antepartum; and Abnormal maternal glucose tolerance, antepartum on her problem list.  Patient reports no complaints.  Contractions: Irregular. Vag. Bleeding: None.  Movement: Present. Denies leaking of fluid. She is planning waterbirth and is here for CNM visit to discuss supplies and preparation for waterbirth.   The following portions of the patient's history were reviewed and updated as appropriate: allergies, current medications, past family history, past medical history, past social history, past surgical history and problem list. Problem list updated.  Objective:   Filed Vitals:   07/01/15 1033  BP: 114/73  Pulse: 81  Weight: 267 lb (121.11 kg)    Fetal Status: Fetal Heart Rate (bpm): 141   Movement: Present     General:  Alert, oriented and cooperative. Patient is in no acute distress.  Skin: Skin is warm and dry. No rash noted.   Cardiovascular: Normal heart rate noted  Respiratory: Normal respiratory effort, no problems with respiration noted  Abdomen: Soft, gravid, appropriate for gestational age. Pain/Pressure: Present     Pelvic: Vag. Bleeding: None Vag D/C Character: Thin   Cervical exam deferred        Extremities: Normal range of motion.  Edema: Trace  Mental Status: Normal mood and affect. Normal behavior. Normal judgment and thought content.   Urinalysis: Urine Protein: Negative Urine Glucose: Negative  Assessment and Plan:  Pregnancy: G1P0000 at [redacted]w[redacted]d   1. Supervision of normal pregnancy  - List of waterbirth supplies and resources given. Contraindications reviewed.   Preterm labor symptoms and general obstetric precautions including but not limited to vaginal bleeding, contractions, leaking of fluid and fetal  movement were reviewed in detail with the patient. Please refer to After Visit Summary for other counseling recommendations.  F/U 1 week   [place], [place]

## 2015-07-01 NOTE — Patient Instructions (Signed)
Thinking About Waterbirth???  You must attend a Waterbirth class at [facility]  3rd Wednesday of every month from 7-9pm  Free  Register by calling [phone] or online at [email or web address]  Bring us the certificate from the class  Waterbirth supplies needed for [facility] patients:  Our practice has a Birth Pool in a Box tub at the hospital that you can borrow  You will need to purchase an accessory kit that has all needed supplies through [facility] ([phone]) or online $175.00  Or you can purchase the supplies separately: o Single-use disposable tub liner for Birth Pool in a Box (REGULAR size) o New garden hose labeled "lead-free", "suitable for drinking water", o Electric drain pump to remove water (We recommend 792 gallon per hour or greater pump.)  o  "non-toxic" OR "water potable" o Garden hose to remove the dirty water o Fish net o Bathing suit top (optional) o Long-handled mirror (optional)  [facility] sells tubs for ~ $120 if you would rather purchase your own tub.  They also sell accessories, liners.    Www.waterbirthsolutions.com for tub purchases and supplies  [employer] (www.thelaborladies.com) $275 for tub rental/set-up & take down/kit   [employer] [employer] information regarding doulas (labor support) who provide pool rentals:  Http://www.padanc.org/MeetUs.htm   [employer] (www.thelaborladies.com)  Http://www.padanc.org/MeetUs.htm   Things that would prevent you from having a waterbirth:  Premature, <37wks  Previous cesarean birth  Presence of thick meconium-stained fluid  Multiple gestation (Twins, triplets, etc.)  Uncontrolled diabetes or gestational diabetes requiring medication  Hypertension  Heavy vaginal bleeding  Non-reassuring fetal heart rate  Active infection (MRSA, etc.)  If your labor has to be induced and induction  method requires continuous monitoring of the baby's heart rate  Other risks/issues identified by your obstetrical provider  

## 2015-07-01 NOTE — Telephone Encounter (Signed)
error 

## 2015-07-02 ENCOUNTER — Telehealth: Payer: Self-pay | Admitting: *Deleted

## 2015-07-02 NOTE — Telephone Encounter (Signed)
Pt called in and left message for nurse to rtn call. Called pt back, no voicemail, no answer

## 2015-07-10 ENCOUNTER — Ambulatory Visit (INDEPENDENT_AMBULATORY_CARE_PROVIDER_SITE_OTHER): Payer: Medicaid Other | Admitting: Family Medicine

## 2015-07-10 ENCOUNTER — Other Ambulatory Visit (HOSPITAL_COMMUNITY)
Admission: RE | Admit: 2015-07-10 | Discharge: 2015-07-10 | Disposition: A | Discharge status: Home / Self Care | Payer: Medicaid Other | Source: Ambulatory Visit | Attending: Family Medicine | Admitting: Family Medicine

## 2015-07-10 VITALS — BP 123/63 | HR 79 | Wt 267.0 lb

## 2015-07-10 DIAGNOSIS — Z113 Encounter for screening for infections with a predominantly sexual mode of transmission: Secondary | ICD-10-CM | POA: Insufficient documentation

## 2015-07-10 DIAGNOSIS — Z3490 Encounter for supervision of normal pregnancy, unspecified, unspecified trimester: Secondary | ICD-10-CM

## 2015-07-10 DIAGNOSIS — Z36 Encounter for antenatal screening of mother: Secondary | ICD-10-CM

## 2015-07-10 DIAGNOSIS — Z3403 Encounter for supervision of normal first pregnancy, third trimester: Secondary | ICD-10-CM

## 2015-07-10 NOTE — Progress Notes (Signed)
Subjective:  Karen Soto is a 25 y.o. G1P0000 at [redacted]w[redacted]d being seen today for ongoing prenatal care.  She is currently monitored for the following issues for this low-risk pregnancy and has Supervision of normal first pregnancy; Obesity in pregnancy, antepartum; and Abnormal maternal glucose tolerance, antepartum on her problem list.  Patient reports no complaints.  Contractions: Irregular. Vag. Bleeding: None.  Movement: Present. Denies leaking of fluid.   The following portions of the patient's history were reviewed and updated as appropriate: allergies, current medications, past family history, past medical history, past social history, past surgical history and problem list. Problem list updated.  Objective:   Filed Vitals:   07/10/15 1328  BP: 123/63  Pulse: 79  Weight: 267 lb (121.11 kg)    Fetal Status: Fetal Heart Rate (bpm): 147   Movement: Present     General:  Alert, oriented and cooperative. Patient is in no acute distress.  Skin: Skin is warm and dry. No rash noted.   Cardiovascular: Normal heart rate noted  Respiratory: Normal respiratory effort, no problems with respiration noted  Abdomen: Soft, gravid, appropriate for gestational age. Pain/Pressure: Present     Pelvic: Vag. Bleeding: None Vag D/C Character: Thin   Cervical exam deferred        Extremities: Normal range of motion.  Edema: Trace  Mental Status: Normal mood and affect. Normal behavior. Normal judgment and thought content.   Urinalysis: Urine Protein: Negative Urine Glucose: Negative  Assessment and Plan:  Pregnancy: G1P0000 at [redacted]w[redacted]d  1. Prenatal care, unspecified trimester FHT normal, FH normal - GC/Chlamydia probe amp ([place])not at [facility] - Culture, beta strep (group b only)  2. Encounter for supervision of normal first pregnancy in third trimester   Term labor symptoms and general obstetric precautions including but not limited to vaginal bleeding, contractions, leaking of fluid and fetal  movement were reviewed in detail with the patient. Please refer to After Visit Summary for other counseling recommendations.  No Follow-up on file.   Levie Heritage, DO

## 2015-07-11 LAB — GC/CHLAMYDIA PROBE AMP (~~LOC~~) NOT AT ARMC
CHLAMYDIA, DNA PROBE: POSITIVE — AB
Neisseria Gonorrhea: NEGATIVE

## 2015-07-12 ENCOUNTER — Telehealth: Payer: Self-pay

## 2015-07-12 LAB — OB RESULTS CONSOLE GBS: GBS: NEGATIVE

## 2015-07-12 MED ORDER — AZITHROMYCIN 500 MG PO TABS: 1000.0000 mg | ORAL_TABLET | Freq: Once | ORAL | Status: DC

## 2015-07-12 NOTE — Telephone Encounter (Signed)
Called patient and left message tocall back today before 12. Armandina Stammer RN BSN

## 2015-07-12 NOTE — Telephone Encounter (Signed)
Patient called back and made aware that she had a positive chlmydia result. Patient made aware that she will need to pick up the medication and take it immediately. Patient states understanding. Patient also made aware that she will need to abstain from intercourse until both her and her partner are treated. Patient states understanding. Armandina Stammer RN BSN

## 2015-07-12 NOTE — Telephone Encounter (Signed)
-----   Message from Levie Heritage, DO sent at 07/12/2015  8:05 AM EST ----- + chlamydia.  Please send notify patient and send azithromycin  to pharmacy.  Partner will need to be treated as well.

## 2015-07-13 LAB — CULTURE, BETA STREP (GROUP B ONLY)

## 2015-07-18 ENCOUNTER — Ambulatory Visit (INDEPENDENT_AMBULATORY_CARE_PROVIDER_SITE_OTHER): Payer: Medicaid Other | Admitting: Family Medicine

## 2015-07-18 VITALS — BP 128/65 | HR 87 | Wt 269.0 lb

## 2015-07-18 DIAGNOSIS — O98813 Other maternal infectious and parasitic diseases complicating pregnancy, third trimester: Secondary | ICD-10-CM

## 2015-07-18 DIAGNOSIS — A749 Chlamydial infection, unspecified: Secondary | ICD-10-CM | POA: Insufficient documentation

## 2015-07-18 DIAGNOSIS — Z3403 Encounter for supervision of normal first pregnancy, third trimester: Secondary | ICD-10-CM

## 2015-07-18 DIAGNOSIS — O98313 Other infections with a predominantly sexual mode of transmission complicating pregnancy, third trimester: Secondary | ICD-10-CM

## 2015-07-18 NOTE — Progress Notes (Signed)
Subjective:  Karen Soto is a 25 y.o. G1P0000 at [redacted]w[redacted]d being seen today for ongoing prenatal care.  She is currently monitored for the following issues for this low-risk pregnancy and has Supervision of normal first pregnancy; Obesity in pregnancy, antepartum; Abnormal maternal glucose tolerance, antepartum; and Chlamydia infection affecting pregnancy in third trimester, antepartum on her problem list.  Patient reports no complaints.  Contractions: Irregular. Vag. Bleeding: None.  Movement: Present. Denies leaking of fluid.   The following portions of the patient's history were reviewed and updated as appropriate: allergies, current medications, past family history, past medical history, past social history, past surgical history and problem list. Problem list updated.  Objective:   Filed Vitals:   07/18/15 0921  BP: 128/65  Pulse: 87  Weight: 269 lb (122.018 kg)    Fetal Status: Fetal Heart Rate (bpm): 145   Movement: Present  Presentation: Vertex  General:  Alert, oriented and cooperative. Patient is in no acute distress.  Skin: Skin is warm and dry. No rash noted.   Cardiovascular: Normal heart rate noted  Respiratory: Normal respiratory effort, no problems with respiration noted  Abdomen: Soft, gravid, appropriate for gestational age. Pain/Pressure: Present     Pelvic: Vag. Bleeding: None Vag D/C Character: White   Cervical exam performed Dilation: Fingertip Effacement (%): 40    Extremities: Normal range of motion.  Edema: Trace  Mental Status: Normal mood and affect. Normal behavior. Normal judgment and thought content.   Urinalysis: Urine Protein: Trace Urine Glucose: Negative  Assessment and Plan:  Pregnancy: G1P0000 at [redacted]w[redacted]d  1. Encounter for supervision of normal first pregnancy in third trimester FHT and FH normal  2. Chlamydia infection affecting pregnancy in third trimester, antepartum Treated 07/12/15.  Will need TOC.  Preterm labor symptoms and general  obstetric precautions including but not limited to vaginal bleeding, contractions, leaking of fluid and fetal movement were reviewed in detail with the patient. Please refer to After Visit Summary for other counseling recommendations.  No Follow-up on file.   Levie Heritage, DO

## 2015-07-24 ENCOUNTER — Ambulatory Visit (INDEPENDENT_AMBULATORY_CARE_PROVIDER_SITE_OTHER): Payer: Medicaid Other | Admitting: Family Medicine

## 2015-07-24 VITALS — BP 129/68 | HR 86 | Wt 268.0 lb

## 2015-07-24 DIAGNOSIS — Z3403 Encounter for supervision of normal first pregnancy, third trimester: Secondary | ICD-10-CM

## 2015-07-24 NOTE — Patient Instructions (Signed)

## 2015-07-24 NOTE — Progress Notes (Signed)
  Subjective:  Karen Soto is a 25 y.o. G1P0000 at [redacted]w[redacted]d being seen today for ongoing prenatal care.  She is currently monitored for the following issues for this low-risk pregnancy and has Supervision of normal first pregnancy; Obesity in pregnancy, antepartum; Abnormal maternal glucose tolerance, antepartum; and Chlamydia infection affecting pregnancy in third trimester, antepartum on her problem list.  Patient reports no complaints.  Contractions: Irritability. Vag. Bleeding: None.  Movement: Present. Denies leaking of fluid.   The following portions of the patient's history were reviewed and updated as appropriate: allergies, current medications, past family history, past medical history, past social history, past surgical history and problem list. Problem list updated.  Objective:   Filed Vitals:   07/24/15 1410  BP: 129/68  Pulse: 86  Weight: 268 lb (121.564 kg)    Fetal Status:     Movement: Present  Presentation: Vertex  General:  Alert, oriented and cooperative. Patient is in no acute distress.  Skin: Skin is warm and dry. No rash noted.   Cardiovascular: Normal heart rate noted  Respiratory: Normal respiratory effort, no problems with respiration noted  Abdomen: Soft, gravid, appropriate for gestational age. Pain/Pressure: Present     Pelvic: Vag. Bleeding: None Vag D/C Character: Thin   Cervical exam performed Dilation: Fingertip Effacement (%): 40    Extremities: Normal range of motion.  Edema: Trace  Mental Status: Normal mood and affect. Normal behavior. Normal judgment and thought content.   Urinalysis:      Assessment and Plan:  Pregnancy: G1P0000 at [redacted]w[redacted]d  1. Encounter for supervision of normal first pregnancy in third trimester FHT normal, FH normal  Term labor symptoms and general obstetric precautions including but not limited to vaginal bleeding, contractions, leaking of fluid and fetal movement were reviewed in detail with the patient. Please refer to  After Visit Summary for other counseling recommendations.  Return in about 1 week (around 07/31/2015) for OB f/u.   Levie Heritage, DO

## 2015-07-31 ENCOUNTER — Ambulatory Visit (INDEPENDENT_AMBULATORY_CARE_PROVIDER_SITE_OTHER): Payer: Medicaid Other | Admitting: Obstetrics & Gynecology

## 2015-07-31 ENCOUNTER — Encounter: Payer: Self-pay | Admitting: Obstetrics & Gynecology

## 2015-07-31 VITALS — BP 116/71 | HR 84 | Wt 275.0 lb

## 2015-07-31 DIAGNOSIS — A749 Chlamydial infection, unspecified: Secondary | ICD-10-CM

## 2015-07-31 DIAGNOSIS — O98313 Other infections with a predominantly sexual mode of transmission complicating pregnancy, third trimester: Secondary | ICD-10-CM

## 2015-07-31 DIAGNOSIS — Z3403 Encounter for supervision of normal first pregnancy, third trimester: Secondary | ICD-10-CM

## 2015-07-31 DIAGNOSIS — O99213 Obesity complicating pregnancy, third trimester: Secondary | ICD-10-CM

## 2015-07-31 DIAGNOSIS — E669 Obesity, unspecified: Secondary | ICD-10-CM

## 2015-07-31 DIAGNOSIS — O98813 Other maternal infectious and parasitic diseases complicating pregnancy, third trimester: Secondary | ICD-10-CM

## 2015-07-31 DIAGNOSIS — O99810 Abnormal glucose complicating pregnancy: Secondary | ICD-10-CM

## 2015-07-31 NOTE — Progress Notes (Signed)
Subjective:  Karen Soto is a 25 y.o. G1P0000 at 9861w6d being seen today for ongoing prenatal care.  She is currently monitored for the following issues for this low-risk pregnancy and has Supervision of normal first pregnancy; Obesity in pregnancy, antepartum; Abnormal maternal glucose tolerance, antepartum; and Chlamydia infection affecting pregnancy in third trimester, antepartum on her problem list.  Patient reports no complaints.  Contractions: Irregular. Vag. Bleeding: None.  Movement: Present. Denies leaking of fluid.   The following portions of the patient's history were reviewed and updated as appropriate: allergies, current medications, past family history, past medical history, past social history, past surgical history and problem list. Problem list updated.  Objective:   Filed Vitals:   07/31/15 1423  BP: 116/71  Pulse: 84  Weight: 275 lb (124.739 kg)    Fetal Status: Fetal Heart Rate (bpm): 147   Movement: Present     General:  Alert, oriented and cooperative. Patient is in no acute distress.  Skin: Skin is warm and dry. No rash noted.   Cardiovascular: Normal heart rate noted  Respiratory: Normal respiratory effort, no problems with respiration noted  Abdomen: Soft, gravid, appropriate for gestational age. Pain/Pressure: Present     Pelvic: Vag. Bleeding: None Vag D/C Character: Thin   Cervical exam performed        Extremities: Normal range of motion.  Edema: Trace  Mental Status: Normal mood and affect. Normal behavior. Normal judgment and thought content.   Urinalysis: Urine Protein: Negative Urine Glucose: Negative  Assessment and Plan:  Pregnancy: G1P0000 at 2761w6d  1. Encounter for supervision of normal first pregnancy in third trimester Reviewed labor precautions  2. Obesity in pregnancy, antepartum, third trimester  3. Abnormal maternal glucose tolerance, antepartum Normal 3 hour GTT  4. Chlamydia infection affecting pregnancy in third trimester,  antepartum   Term labor symptoms and general obstetric precautions including but not limited to vaginal bleeding, contractions, leaking of fluid and fetal movement were reviewed in detail with the patient. Please refer to After Visit Summary for other counseling recommendations.  Return in about 1 week (around 08/07/2015).   Willodean Rosenthalarolyn Harraway-Smith, MD

## 2015-07-31 NOTE — Patient Instructions (Signed)

## 2015-08-07 ENCOUNTER — Other Ambulatory Visit (HOSPITAL_COMMUNITY)
Admission: RE | Admit: 2015-08-07 | Discharge: 2015-08-07 | Disposition: A | Discharge status: Home / Self Care | Payer: Medicaid Other | Source: Ambulatory Visit | Attending: Family Medicine | Admitting: Family Medicine

## 2015-08-07 ENCOUNTER — Ambulatory Visit (INDEPENDENT_AMBULATORY_CARE_PROVIDER_SITE_OTHER): Payer: Medicaid Other | Admitting: Family Medicine

## 2015-08-07 VITALS — BP 131/69 | HR 91 | Wt 277.0 lb

## 2015-08-07 DIAGNOSIS — Z113 Encounter for screening for infections with a predominantly sexual mode of transmission: Secondary | ICD-10-CM | POA: Insufficient documentation

## 2015-08-07 DIAGNOSIS — A749 Chlamydial infection, unspecified: Secondary | ICD-10-CM

## 2015-08-07 DIAGNOSIS — O480 Post-term pregnancy: Secondary | ICD-10-CM

## 2015-08-07 DIAGNOSIS — O98313 Other infections with a predominantly sexual mode of transmission complicating pregnancy, third trimester: Secondary | ICD-10-CM

## 2015-08-07 DIAGNOSIS — O98813 Other maternal infectious and parasitic diseases complicating pregnancy, third trimester: Secondary | ICD-10-CM

## 2015-08-07 DIAGNOSIS — Z3403 Encounter for supervision of normal first pregnancy, third trimester: Secondary | ICD-10-CM

## 2015-08-07 LAB — OB RESULTS CONSOLE GC/CHLAMYDIA: Gonorrhea: NEGATIVE

## 2015-08-07 NOTE — Patient Instructions (Signed)
Labor Induction Labor induction is when steps are taken to cause a pregnant woman to begin the labor process. Most women go into labor on their own between 37 weeks and 42 weeks of the pregnancy. When this does not happen or when there is a medical need, methods may be used to induce labor. Labor induction causes a pregnant woman's uterus to contract. It also causes the cervix to soften (ripen), open (dilate), and thin out (efface). Usually, labor is not induced before 39 weeks of the pregnancy unless there is a problem with the baby or mother.  Before inducing labor, your health care provider will consider a number of factors, including the following:  The medical condition of you and the baby.   How many weeks along you are.   The status of the baby's lung maturity.   The condition of the cervix.   The position of the baby.  WHAT ARE THE REASONS FOR LABOR INDUCTION? Labor may be induced for the following reasons:  The health of the baby or mother is at risk.   The pregnancy is overdue by 1 week or more.   The water breaks but labor does not start on its own.   The mother has a health condition or serious illness, such as high blood pressure, infection, placental abruption, or diabetes.  The amniotic fluid amounts are low around the baby.   The baby is distressed.  Convenience or wanting the baby to be born on a certain date is not a reason for inducing labor. WHAT METHODS ARE USED FOR LABOR INDUCTION? Several methods of labor induction may be used, such as:   Prostaglandin medicine. This medicine causes the cervix to dilate and ripen. The medicine will also start contractions. It can be taken by mouth or by inserting a suppository into the vagina.   Inserting a thin tube (catheter) with a balloon on the end into the vagina to dilate the cervix. Once inserted, the balloon is expanded with water, which causes the cervix to open.   Stripping the membranes. Your health  care provider separates amniotic sac tissue from the cervix, causing the cervix to be stretched and causing the release of a hormone called progesterone. This may cause the uterus to contract. It is often done during an office visit. You will be sent home to wait for the contractions to begin. You will then come in for an induction.   Breaking the water. Your health care provider makes a hole in the amniotic sac using a small instrument. Once the amniotic sac breaks, contractions should begin. This may still take hours to see an effect.   Medicine to trigger or strengthen contractions. This medicine is given through an IV access tube inserted into a vein in your arm.  All of the methods of induction, besides stripping the membranes, will be done in the hospital. Induction is done in the hospital so that you and the baby can be carefully monitored.  HOW LONG DOES IT TAKE FOR LABOR TO BE INDUCED? Some inductions can take up to 2-3 days. Depending on the cervix, it usually takes less time. It takes longer when you are induced early in the pregnancy or if this is your first pregnancy. If a mother is still pregnant and the induction has been going on for 2-3 days, either the mother will be sent home or a cesarean delivery will be needed. WHAT ARE THE RISKS ASSOCIATED WITH LABOR INDUCTION? Some of the risks of induction include:     Changes in fetal heart rate, such as too high, too low, or erratic.   Fetal distress.   Chance of infection for the mother and baby.   Increased chance of having a cesarean delivery.   Breaking off (abruption) of the placenta from the uterus (rare).   Uterine rupture (very rare).  When induction is needed for medical reasons, the benefits of induction may outweigh the risks. WHAT ARE SOME REASONS FOR NOT INDUCING LABOR? Labor induction should not be done if:   It is shown that your baby does not tolerate labor.   You have had previous surgeries on your  uterus, such as a myomectomy or the removal of fibroids.   Your placenta lies very low in the uterus and blocks the opening of the cervix (placenta previa).   Your baby is not in a head-down position.   The umbilical cord drops down into the birth canal in front of the baby. This could cut off the baby's blood and oxygen supply.   You have had a previous cesarean delivery.   There are unusual circumstances, such as the baby being extremely premature.    This information is not intended to replace advice given to you by your health care provider. Make sure you discuss any questions you have with your health care provider.   Document Released: 09/30/2006 Document Revised: 06/01/2014 Document Reviewed: 12/08/2012 Elsevier Interactive Patient Education 2016 [employer]  

## 2015-08-07 NOTE — Progress Notes (Signed)
Subjective:  Karen Soto is a 25 y.o. G1P0000 at 5469w6d being seen today for ongoing prenatal care.  She is currently monitored for the following issues for this low-risk pregnancy and has Supervision of normal first pregnancy; Obesity in pregnancy, antepartum; Abnormal maternal glucose tolerance, antepartum; and Chlamydia infection affecting pregnancy in third trimester, antepartum on her problem list.  Patient reports no complaints.  Contractions: Irregular. Vag. Bleeding: None.  Movement: Present. Denies leaking of fluid.   The following portions of the patient's history were reviewed and updated as appropriate: allergies, current medications, past family history, past medical history, past social history, past surgical history and problem list. Problem list updated.  Objective:   Filed Vitals:   08/07/15 1413  BP: 131/69  Pulse: 91  Weight: 277 lb (125.646 kg)    Fetal Status: Fetal Heart Rate (bpm): 162 Fundal Height: 42 cm Movement: Present  Presentation: Vertex  General:  Alert, oriented and cooperative. Patient is in no acute distress.  Skin: Skin is warm and dry. No rash noted.   Cardiovascular: Normal heart rate noted  Respiratory: Normal respiratory effort, no problems with respiration noted  Abdomen: Soft, gravid, appropriate for gestational age. Pain/Pressure: Present     Pelvic: Vag. Bleeding: None Vag D/C Character: Thin   Cervical exam performed        Extremities: Normal range of motion.  Edema: Trace  Mental Status: Normal mood and affect. Normal behavior. Normal judgment and thought content.   Urinalysis: Urine Protein: Trace Urine Glucose: Negative  Assessment and Plan:  Pregnancy: G1P0000 at 6869w6d  1. Encounter for supervision of normal first pregnancy in third trimester NST today - reactive.  Will schedule induction for Sunday  2. Chlamydia infection affecting pregnancy in third trimester, antepartum TOC today  There are no diagnoses linked to this  encounter. Term labor symptoms and general obstetric precautions including but not limited to vaginal bleeding, contractions, leaking of fluid and fetal movement were reviewed in detail with the patient. Please refer to After Visit Summary for other counseling recommendations.  No Follow-up on file.   Levie HeritageJacob J Stinson, DO

## 2015-08-08 ENCOUNTER — Encounter (HOSPITAL_COMMUNITY): Payer: Self-pay | Admitting: *Deleted

## 2015-08-08 ENCOUNTER — Telehealth (HOSPITAL_COMMUNITY): Payer: Self-pay | Admitting: *Deleted

## 2015-08-08 LAB — GC/CHLAMYDIA PROBE AMP (~~LOC~~) NOT AT ARMC
Chlamydia: NEGATIVE
NEISSERIA GONORRHEA: NEGATIVE

## 2015-08-08 NOTE — Telephone Encounter (Signed)
Preadmission screen  

## 2015-08-09 ENCOUNTER — Inpatient Hospital Stay (HOSPITAL_COMMUNITY)
Admission: AD | Admit: 2015-08-09 | Discharge: 2015-08-12 | DRG: 775 | Disposition: A | Discharge status: Home / Self Care | Payer: Medicaid Other | Source: Ambulatory Visit | Attending: Obstetrics & Gynecology | Admitting: Obstetrics & Gynecology

## 2015-08-09 ENCOUNTER — Encounter (HOSPITAL_COMMUNITY): Payer: Self-pay | Admitting: *Deleted

## 2015-08-09 DIAGNOSIS — O99810 Abnormal glucose complicating pregnancy: Secondary | ICD-10-CM

## 2015-08-09 DIAGNOSIS — Z3A41 41 weeks gestation of pregnancy: Secondary | ICD-10-CM | POA: Diagnosis not present

## 2015-08-09 DIAGNOSIS — Z3403 Encounter for supervision of normal first pregnancy, third trimester: Secondary | ICD-10-CM

## 2015-08-09 DIAGNOSIS — O4212 Full-term premature rupture of membranes, onset of labor more than 24 hours following rupture: Principal | ICD-10-CM | POA: Diagnosis present

## 2015-08-09 DIAGNOSIS — O99213 Obesity complicating pregnancy, third trimester: Secondary | ICD-10-CM

## 2015-08-09 DIAGNOSIS — E669 Obesity, unspecified: Secondary | ICD-10-CM | POA: Diagnosis present

## 2015-08-09 DIAGNOSIS — Z6841 Body Mass Index (BMI) 40.0 and over, adult: Secondary | ICD-10-CM

## 2015-08-09 DIAGNOSIS — IMO0001 Reserved for inherently not codable concepts without codable children: Secondary | ICD-10-CM

## 2015-08-09 DIAGNOSIS — Z6791 Unspecified blood type, Rh negative: Secondary | ICD-10-CM

## 2015-08-09 DIAGNOSIS — O26893 Other specified pregnancy related conditions, third trimester: Secondary | ICD-10-CM | POA: Diagnosis present

## 2015-08-09 DIAGNOSIS — O26899 Other specified pregnancy related conditions, unspecified trimester: Secondary | ICD-10-CM

## 2015-08-09 DIAGNOSIS — Z23 Encounter for immunization: Secondary | ICD-10-CM | POA: Diagnosis not present

## 2015-08-09 DIAGNOSIS — O4292 Full-term premature rupture of membranes, unspecified as to length of time between rupture and onset of labor: Secondary | ICD-10-CM | POA: Diagnosis not present

## 2015-08-09 DIAGNOSIS — O480 Post-term pregnancy: Secondary | ICD-10-CM | POA: Diagnosis present

## 2015-08-09 DIAGNOSIS — J45909 Unspecified asthma, uncomplicated: Secondary | ICD-10-CM | POA: Diagnosis present

## 2015-08-09 HISTORY — DX: Chlamydial infection, unspecified: A74.9

## 2015-08-09 LAB — CBC
HEMATOCRIT: 36.7 % (ref 36.0–46.0)
HEMOGLOBIN: 12.4 g/dL (ref 12.0–15.0)
MCH: 27 pg (ref 26.0–34.0)
MCHC: 33.8 g/dL (ref 30.0–36.0)
MCV: 79.8 fL (ref 78.0–100.0)
Platelets: 239 10*3/uL (ref 150–400)
RBC: 4.6 MIL/uL (ref 3.87–5.11)
RDW: 14.7 % (ref 11.5–15.5)
WBC: 14.3 10*3/uL — ABNORMAL HIGH (ref 4.0–10.5)

## 2015-08-09 LAB — POCT FERN TEST: POCT FERN TEST: POSITIVE

## 2015-08-09 MED ORDER — LACTATED RINGERS IV SOLN
INTRAVENOUS | Status: DC
  Administered 2015-08-09 – 2015-08-10 (×2): via INTRAVENOUS

## 2015-08-09 MED ORDER — LACTATED RINGERS IV SOLN
500.0000 mL | INTRAVENOUS | Status: DC | PRN
  Administered 2015-08-10: 500 mL via INTRAVENOUS

## 2015-08-09 MED ORDER — MISOPROSTOL 25 MCG QUARTER TABLET
25.0000 ug | ORAL_TABLET | ORAL | Status: DC | PRN
  Administered 2015-08-09 (×2): 25 ug via VAGINAL
  Filled 2015-08-09 (×2): qty 0.25

## 2015-08-09 MED ORDER — ONDANSETRON HCL 4 MG/2ML IJ SOLN: 4.0000 mg | Freq: Four times a day (QID) | INTRAMUSCULAR | Status: DC | PRN

## 2015-08-09 MED ORDER — OXYTOCIN 10 UNIT/ML IJ SOLN
2.5000 [IU]/h | INTRAVENOUS | Status: DC
  Filled 2015-08-09: qty 10

## 2015-08-09 MED ORDER — CITRIC ACID-SODIUM CITRATE 334-500 MG/5ML PO SOLN: 30.0000 mL | ORAL | Status: DC | PRN

## 2015-08-09 MED ORDER — LIDOCAINE HCL (PF) 1 % IJ SOLN
30.0000 mL | INTRAMUSCULAR | Status: DC | PRN
  Filled 2015-08-09: qty 30

## 2015-08-09 MED ORDER — FLEET ENEMA 7-19 GM/118ML RE ENEM: 1.0000 | ENEMA | RECTAL | Status: DC | PRN

## 2015-08-09 MED ORDER — OXYCODONE-ACETAMINOPHEN 5-325 MG PO TABS: 2.0000 | ORAL_TABLET | ORAL | Status: DC | PRN

## 2015-08-09 MED ORDER — OXYTOCIN BOLUS FROM INFUSION
500.0000 mL | INTRAVENOUS | Status: DC
Start: 2015-08-09 — End: 2015-08-12
  Administered 2015-08-10: 500 mL via INTRAVENOUS

## 2015-08-09 MED ORDER — ACETAMINOPHEN 325 MG PO TABS: 650.0000 mg | ORAL_TABLET | ORAL | Status: DC | PRN

## 2015-08-09 MED ORDER — OXYCODONE-ACETAMINOPHEN 5-325 MG PO TABS: 1.0000 | ORAL_TABLET | ORAL | Status: DC | PRN

## 2015-08-09 MED ORDER — MISOPROSTOL 200 MCG PO TABS
50.0000 ug | ORAL_TABLET | ORAL | Status: DC | PRN
  Administered 2015-08-09 – 2015-08-10 (×2): 50 ug via ORAL
  Filled 2015-08-09 (×2): qty 0.5

## 2015-08-09 MED ORDER — TERBUTALINE SULFATE 1 MG/ML IJ SOLN
0.2500 mg | Freq: Once | INTRAMUSCULAR | Status: DC | PRN
  Filled 2015-08-09: qty 1

## 2015-08-09 NOTE — H&P (Signed)
LABOR AND DELIVERY ADMISSION HISTORY AND PHYSICAL NOTE  Karen Soto is a 25 y.o. female G1P0000 with IUP at 6759w1d by L/9 presenting with rom this morning at 08:00. Mild occasional contractions.  She reports positive fetal movement. She denies vaginal bleeding.  Prenatal History/Complications:  Past Medical History: Past Medical History  Diagnosis Date  . Asthma   . Drooping eyelid 01/04/2012    right  . Headache(784.0) 01/08/2012    "lots since Monday (01/04/2012)"  . Obesity   . Chlamydia     Past Surgical History: Past Surgical History  Procedure Laterality Date  . Appendectomy  01/08/2012  . Incision and drainage of wound  ~ 2010    "MRSA in her back; had it drained @ the hospital"  . Laparoscopic appendectomy  01/08/2012    Procedure: APPENDECTOMY LAPAROSCOPIC;  Surgeon: Robyne AskewPaul S Toth III, MD;  [facility];  Service: General;  Laterality: N/A;  . Appendectomy      Obstetrical History: OB History    Gravida Para Term Preterm AB TAB SAB Ectopic Multiple Living   1 0 0 0 0 0 0 0        Social History: Social History   Social History  . Marital Status: Single    Spouse Name: N/A  . Number of Children: N/A  . Years of Education: N/A   Social History Main Topics  . Smoking status: Never Smoker   . Smokeless tobacco: Never Used  . Alcohol Use: No     Comment: occ  . Drug Use: No     Comment: last used marijuana when pregnant but was not aware of pregnancy.  none since aware of pregnancy approx 04/2015  . Sexual Activity: Yes   Other Topics Concern  . None   Social History Narrative   ** Merged History Encounter **        Family History: Family History  Problem Relation Age of Onset  . Hypertension Mother   . Stroke Mother   . Cancer Father     skin  . Diabetes Paternal Grandmother     Allergies: No Known Allergies  Prescriptions prior to admission  Medication Sig Dispense Refill Last Dose  . amoxicillin (AMOXIL) 500 MG capsule Take 1 capsule  (500 mg total) by mouth 2 (two) times daily. (Patient not taking: Reported on 08/07/2015) 14 capsule 0 Not Taking  . azithromycin (ZITHROMAX) 500 MG tablet Take 2 tablets (1,000 mg total) by mouth once. (Patient not taking: Reported on 08/07/2015) 2 tablet 1 Not Taking     Review of Systems   All systems reviewed and negative except as stated in HPI  Blood pressure 127/85, pulse 84, temperature 98 F (36.7 C), temperature source Oral, resp. rate 18, height 5\' 9"  (1.753 m), weight 275 lb (124.739 kg), last menstrual period 10/25/2014. General appearance: alert, cooperative and appears stated age Lungs: clear to auscultation bilaterally Heart: regular rate and rhythm Abdomen: soft, non-tender; bowel sounds normal Extremities: No calf swelling or tenderness Presentation: cephalic Fetal monitoring: 145/mod/+a/-d Uterine activity: quiet  Dilation: 1.5 Effacement (%): 20 Station: -3 Exam by:: katehrien g jones rn    Prenatal labs: ABO, Rh: --/--/O NEG (03/17 1412) Antibody: PENDING (03/17 1412) Rubella: !Error!imm RPR: NON REAC (12/21 0001)  HBsAg: NEGATIVE (10/19 1329)  HIV: NONREACTIVE (12/21 0001)  GBS: Negative (02/17 0000)  1 hr Glucola: 143, 3 hr 71/96/91/74 Genetic screening:  First wnl Anatomy US: wnl  Prenatal Transfer Tool  Maternal Diabetes: No Genetic Screening: Normal  Maternal Ultrasounds/Referrals: Normal Fetal Ultrasounds or other Referrals:  None Maternal Substance Abuse:  No Significant Maternal Medications:  None Significant Maternal Lab Results: Lab values include: Group B Strep negative, Rh negative  Results for orders placed or performed during the hospital encounter of 08/09/15 (from the past 24 hour(s))  Fern Test   Collection Time: 08/09/15 11:09 AM  Result Value Ref Range   POCT Fern Test Positive = ruptured amniotic membanes   CBC   Collection Time: 08/09/15  2:12 PM  Result Value Ref Range   WBC 14.3 (H) 4.0 - 10.5 K/uL   RBC 4.60 3.87 - 5.11  MIL/uL   Hemoglobin 12.4 12.0 - 15.0 g/dL   HCT 16.1 09.6 - 04.5 %   MCV 79.8 78.0 - 100.0 fL   MCH 27.0 26.0 - 34.0 pg   MCHC 33.8 30.0 - 36.0 g/dL   RDW 40.9 81.1 - 91.4 %   Platelets 239 150 - 400 K/uL  Type and screen [facility]   Collection Time: 08/09/15  2:12 PM  Result Value Ref Range   ABO/RH(D) O NEG    Antibody Screen PENDING    Sample Expiration 08/12/2015     Patient Active Problem List   Diagnosis Date Noted  . Active labor 08/09/2015  . Chlamydia infection affecting pregnancy in third trimester, antepartum 07/18/2015  . Abnormal maternal glucose tolerance, antepartum 05/17/2015  . Obesity in pregnancy, antepartum 05/15/2015  . Supervision of normal first pregnancy 02/11/2015    Assessment: Karen Soto is a 25 y.o. G1P0000 at [redacted]w[redacted]d here for prom 08:00 3/17, not in labor  #Labor: foley and cytotec for cervical ripening #Pain: Water birth #FWB:  cat 1 #ID:  gbs neg #MOF: breast #MOC: condoms, larc discussed #Circ:  Undecided, if desires would be outpatient #Rh neg: no recent bleeding. Rh w/u pp  Silvano Bilis 08/09/2015, 3:21 PM

## 2015-08-09 NOTE — MAU Note (Signed)
Urine in lab 

## 2015-08-09 NOTE — MAU Note (Signed)
Pt states she has been leaking fluid @ [id number], bloody @ first, now clear/yellow.  Has some occasional back pain, unsure if having uc's.

## 2015-08-09 NOTE — Progress Notes (Signed)
Karen Soto is a 25 y.o. G1P0000 at 2514w1d by ultrasound admitted for PROM  Subjective: Patient states her pain is 7/10, no pain medication requested at this time. Occasionally feeling her contractions.  Objective: BP 122/73 mmHg  Pulse 92  Temp(Src) 98.3 F (36.8 C) (Oral)  Resp 20  Ht 5\' 9"  (1.753 m)  Wt 124.739 kg (275 lb)  BMI 40.59 kg/m2  LMP 10/25/2014 (Approximate)      FHT:  FHR: 145 bpm, variability: moderate,  accelerations:  Present,  decelerations:  Absent UC:   occasional SVE:   Dilation: 3.5 Effacement (%): 50 Station: -3 Exam by:: katherine g jones rn   Labs: Lab Results  Component Value Date   WBC 14.3* 08/09/2015   HGB 12.4 08/09/2015   HCT 36.7 08/09/2015   MCV 79.8 08/09/2015   PLT 239 08/09/2015    Assessment / Plan: PROM, latent phase, water birth requested  Labor: Occasional contractions, will add Cytotec  Fetal Wellbeing:  Category I Pain Control:  Labor support without medications I/D:  GBS neg Anticipated MOD:  NSVD  Karen Soto 08/09/2015, 10:33 PM

## 2015-08-10 ENCOUNTER — Inpatient Hospital Stay (HOSPITAL_COMMUNITY): Payer: Medicaid Other | Admitting: Anesthesiology

## 2015-08-10 ENCOUNTER — Encounter (HOSPITAL_COMMUNITY): Payer: Self-pay | Admitting: General Practice

## 2015-08-10 DIAGNOSIS — Z3A41 41 weeks gestation of pregnancy: Secondary | ICD-10-CM

## 2015-08-10 DIAGNOSIS — O4292 Full-term premature rupture of membranes, unspecified as to length of time between rupture and onset of labor: Secondary | ICD-10-CM

## 2015-08-10 LAB — RPR: RPR Ser Ql: NONREACTIVE

## 2015-08-10 LAB — MRSA PCR SCREENING: MRSA by PCR: NEGATIVE

## 2015-08-10 MED ORDER — PHENYLEPHRINE 40 MCG/ML (10ML) SYRINGE FOR IV PUSH (FOR BLOOD PRESSURE SUPPORT)
80.0000 ug | PREFILLED_SYRINGE | INTRAVENOUS | Status: DC | PRN
  Filled 2015-08-10: qty 20
  Filled 2015-08-10: qty 2

## 2015-08-10 MED ORDER — FENTANYL CITRATE (PF) 100 MCG/2ML IJ SOLN
100.0000 ug | INTRAMUSCULAR | Status: DC | PRN
  Administered 2015-08-10 (×2): 100 ug via INTRAVENOUS
  Filled 2015-08-10 (×2): qty 2

## 2015-08-10 MED ORDER — OXYTOCIN 10 UNIT/ML IJ SOLN
1.0000 m[IU]/min | INTRAVENOUS | Status: DC
  Administered 2015-08-10: 2 m[IU]/min via INTRAVENOUS

## 2015-08-10 MED ORDER — LIDOCAINE HCL (PF) 1 % IJ SOLN
INTRAMUSCULAR | Status: DC | PRN
  Administered 2015-08-10: 6 mL via EPIDURAL
  Administered 2015-08-10: 4 mL

## 2015-08-10 MED ORDER — EPHEDRINE 5 MG/ML INJ
10.0000 mg | INTRAVENOUS | Status: DC | PRN
  Filled 2015-08-10: qty 2

## 2015-08-10 MED ORDER — PHENYLEPHRINE 40 MCG/ML (10ML) SYRINGE FOR IV PUSH (FOR BLOOD PRESSURE SUPPORT)
80.0000 ug | PREFILLED_SYRINGE | INTRAVENOUS | Status: DC | PRN
  Filled 2015-08-10: qty 2

## 2015-08-10 MED ORDER — DIPHENHYDRAMINE HCL 50 MG/ML IJ SOLN: 12.5000 mg | INTRAMUSCULAR | Status: DC | PRN

## 2015-08-10 MED ORDER — FENTANYL 2.5 MCG/ML BUPIVACAINE 1/10 % EPIDURAL INFUSION (WH - ANES)
14.0000 mL/h | INTRAMUSCULAR | Status: DC | PRN
Start: 2015-08-10 — End: 2015-08-12
  Administered 2015-08-10: 14 mL/h via EPIDURAL
  Filled 2015-08-10: qty 125

## 2015-08-10 MED ORDER — LACTATED RINGERS IV SOLN
500.0000 mL | Freq: Once | INTRAVENOUS | Status: AC
  Administered 2015-08-10: 500 mL via INTRAVENOUS

## 2015-08-10 NOTE — Progress Notes (Signed)
Labor Progress Note  Karen Soto is a 25 y.o. G1P0000 at 4230w2d  admitted for PROM  S: Patient is uncomfortable with contractions. Contractions are becoming more frequent and uncomfortable.   O:  BP 129/68 mmHg  Pulse 101  Temp(Src) 99 F (37.2 C) (Oral)  Resp 20  Ht 5\' 9"  (1.753 m)  Wt 275 lb (124.739 kg)  BMI 40.59 kg/m2  LMP 10/25/2014 (Approximate)    FHT:  FHR: 145 bpm, variability: moderate,  accelerations:  Present,  decelerations:  Present variable UC:   regular, every 2-5 minutes SVE:   Dilation: 4.5 Effacement (%): 80 Station: -2 Exam by:: Wouk SROM @0800  3/17  Labs: Lab Results  Component Value Date   WBC 14.3* 08/09/2015   HGB 12.4 08/09/2015   HCT 36.7 08/09/2015   MCV 79.8 08/09/2015   PLT 239 08/09/2015    Assessment / Plan: 25 y.o. G1P0000 2230w2d in early labor PROM now for >24hrs, latent phase, augmenting labor  Labor: prolonged latent phase, s/p Cytotec without changes, will try nipple stimulation as patient still desires water birth. If this does not change cervix adequately will go to Pitocin. Patient agreeable.  PROM: No signs of Triple I. Continue to monitor fever curve.  Fetal Wellbeing:  Category II Pain Control:  Labor support without medications and Water tub Anticipated MOD:  NSVD  Expectant management   Caryl AdaJazma Anayeli Arel, DO 08/10/2015, 9:47 AM PGY-2, [facility]

## 2015-08-10 NOTE — Progress Notes (Signed)
Labor Progress Note  Karen Soto is a 25 y.o. G1P0000 at 3083w2d  admitted for PROM  S: Patient is uncomfortable with contractions. Has been in the tub and tolerating well.   O:  BP 129/68 mmHg  Pulse 101  Temp(Src) 98.7 F (37.1 C) (Oral)  Resp 20  Ht 5\' 9"  (1.753 m)  Wt 275 lb (124.739 kg)  BMI 40.59 kg/m2  LMP 10/25/2014 (Approximate)    FHT:  FHR: 145 bpm, variability: moderate,  accelerations:  Present,  decelerations:  Present early UC:   regular, every 2-5 minutes SVE:   Dilation: 7 Effacement (%): 80 Station: -1 Exam by:: Calil Amor/ Smith SROM @0800  3/17  Labs: Lab Results  Component Value Date   WBC 14.3* 08/09/2015   HGB 12.4 08/09/2015   HCT 36.7 08/09/2015   MCV 79.8 08/09/2015   PLT 239 08/09/2015    Assessment / Plan: 25 y.o. G1P0000 4383w2d in active labor PROM now for >24hrs  Labor: Progressing normally, s/p Cytotec, continue nipple stimulation as this is working well for patient PROM: No signs of Triple I. Continue to monitor fever curve.  Fetal Wellbeing:  Category I Pain Control:  Labor support without medications and Water tub Anticipated MOD:  NSVD via water birth; [place] midwife updated   Expectant management   Caryl AdaJazma Ivi Griffith, DO 08/10/2015, 1:17 PM PGY-2, [facility]

## 2015-08-10 NOTE — Progress Notes (Signed)
Patient ID: Karen Soto, female   DOB: [birth year], 25 y.o.   MRN: [id number] Karen LamChardena Kopper is a 25 y.o. G1P0000 at 2757w2d.  Subjective: Rested well w/ Fentanyl, but is exhausted and requesting epidural. Offered getting back in tub. Does not want to.   Objective: BP 120/77 mmHg  Pulse 99  Temp(Src) 97.9 F (36.6 C) (Oral)  Resp 22  Ht 5\' 9"  (1.753 m)  Wt 275 lb (124.739 kg)  BMI 40.59 kg/m2  LMP 10/25/2014 (Approximate)   FHT:  FHR: 135 bpm, variability: mod,  accelerations:  10x10,  decelerations:  Earlies, variables UC:   Q 2-3 minutes, strong   Dilation: 7 Effacement (%): 80 Cervical Position: Posterior Station: -1 Presentation: Vertex Exam by:: Ivonne AndrewV. Aneisa Karren  Labs: Results for orders placed or performed during the hospital encounter of 08/09/15 (from the past 24 hour(s))  MRSA PCR Screening     Status: None   Collection Time: 08/10/15  8:00 AM  Result Value Ref Range   MRSA by PCR NEGATIVE NEGATIVE    Assessment / Plan: 7257w2d week IUP Labor: Transition, protracted Fetal Wellbeing:  Category I-II Pain Control:  Fentanyl, requesting epidural Anticipated MOD:  SVD  [place] Karess Harner, CNM 08/10/2015 5:26 PM

## 2015-08-10 NOTE — Progress Notes (Signed)
LABOR PROGRESS NOTE  Karen Soto is a 25 y.o. G1P0000 at 6346w2d  admitted for prom  Subjective: Pain much improved s/p epidural  Objective: BP 118/79 mmHg  Pulse 147  Temp(Src) 98.8 F (37.1 C) (Oral)  Resp 22  Ht 5\' 9"  (1.753 m)  Wt 275 lb (124.739 kg)  BMI 40.59 kg/m2  LMP 10/25/2014 (Approximate) or  Filed Vitals:   08/10/15 1840 08/10/15 1845 08/10/15 1850 08/10/15 1900  BP: 116/64 122/70 115/76 118/79  Pulse: 125 120 121 147  Temp:      TempSrc:      Resp:      Height:      Weight:        145/mod/+a/-d  Dilation: 8 Effacement (%): 90 Cervical Position: Posterior Station: 0 Presentation: Vertex Exam by:: Smith  Labs: Lab Results  Component Value Date   WBC 14.3* 08/09/2015   HGB 12.4 08/09/2015   HCT 36.7 08/09/2015   MCV 79.8 08/09/2015   PLT 239 08/09/2015    Patient Active Problem List   Diagnosis Date Noted  . Active labor 08/09/2015  . Rh negative state in antepartum period 08/09/2015  . Chlamydia infection affecting pregnancy in third trimester, antepartum 07/18/2015  . Abnormal maternal glucose tolerance, antepartum 05/17/2015  . Obesity in pregnancy, antepartum 05/15/2015  . Supervision of normal first pregnancy 02/11/2015    Assessment / Plan: 25 y.o. G1P0000 at 2946w2d here for prom  Labor: now complete, not augmented. To labor down Fetal Wellbeing:  Cat 1. fse placed as fetal heart rate approximate to maternal heart rate Pain Control:  S/p epidural Anticipated MOD:  Vaginal PROM: now ruptured 35 hours, no signs triple I, continuing to monitor  Silvano BilisNoah B Karen Rossa, MD 08/10/2015, 7:03 PM

## 2015-08-10 NOTE — Anesthesia Preprocedure Evaluation (Signed)
Anesthesia Evaluation  Patient identified by MRN, date of birth, ID band Patient awake    Reviewed: Allergy & Precautions, H&P , Patient's Chart, lab work & pertinent test results  Airway Mallampati: II  TM Distance: >3 FB Neck ROM: full    Dental  (+) Teeth Intact   Pulmonary asthma ,  breath sounds clear to auscultation        Cardiovascular Rhythm:regular Rate:Normal     Neuro/Psych    GI/Hepatic   Endo/Other  Morbid obesity  Renal/GU      Musculoskeletal   Abdominal   Peds  Hematology   Anesthesia Other Findings       Reproductive/Obstetrics (+) Pregnancy                             Anesthesia Physical Anesthesia Plan  ASA: III  Anesthesia Plan: Epidural   Post-op Pain Management:    Induction:   Airway Management Planned:   Additional Equipment:   Intra-op Plan:   Post-operative Plan:   Informed Consent: I have reviewed the patients History and Physical, chart, labs and discussed the procedure including the risks, benefits and alternatives for the proposed anesthesia with the patient or authorized representative who has indicated his/her understanding and acceptance.   Dental Advisory Given  Plan Discussed with:   Anesthesia Plan Comments: (Labs checked- platelets confirmed with RN in room. Fetal heart tracing, per RN, reported to be stable enough for sitting procedure. Discussed epidural, and patient consents to the procedure:  included risk of possible headache,backache, failed block, allergic reaction, and nerve injury. This patient was asked if she had any questions or concerns before the procedure started.)        Anesthesia Quick Evaluation  

## 2015-08-10 NOTE — Progress Notes (Signed)
Karen Soto is a 25 y.o. G1P0000 at 562w2d  admitted for rupture of membranes  Subjective: Feeling mostly comfortable although having intermittent rectal pressure; Pit started to inc ctx frequency @ 2015  Objective: BP 130/61 mmHg  Pulse 125  Temp(Src) 98.2 F (36.8 C) (Oral)  Resp 22  Ht 5\' 9"  (1.753 m)  Wt 124.739 kg (275 lb)  BMI 40.59 kg/m2  SpO2 99%  LMP 10/25/2014 (Approximate)   Total I/O In: -  Out: 250 [Urine:250]  FHT:  FHR: 140-145 bpm, variability: moderate,  accelerations:  Present,  decelerations:  Absent- early variables w/ pushing/ctx UC:   Difficult to trace, but palp q 4 mins SVE:   Dilation: 10 Effacement (%): 100 Station: +1 Exam by:: Otilio MiuKime Mao Lockner- caput @ +2 station  Labs: Lab Results  Component Value Date   WBC 14.3* 08/09/2015   HGB 12.4 08/09/2015   HCT 36.7 08/09/2015   MCV 79.8 08/09/2015   PLT 239 08/09/2015    Assessment / Plan: IUP@term  End 1st stage Prolonged ROM x 37 hrs- remains afebrile  Will begin pushing w/ ctx Anticipate SVD  Chauna Osoria CNM 08/10/2015, 9:37 PM

## 2015-08-10 NOTE — Progress Notes (Signed)
Patient ID: Karen Soto, female   DOB: 20-Apr-1991, 25 y.o.   MRN: [id number] Karen Soto is a 25 y.o. G1P0000 at 4775w2d.  Subjective: Moderately uncomfortable, but coping well w/ UC's.  Objective: BP 130/76 mmHg  Pulse 101  Temp(Src) 98.5 F (36.9 C) (Oral)  Resp 20  Ht 5\' 9"  (1.753 m)  Wt 275 lb (124.739 kg)  BMI 40.59 kg/m2  LMP 10/25/2014 (Approximate)   FHT:  FHR: 145 bpm, variability: mod,  accelerations:  10x10,  decelerations:  earlies UC:   Q 2-5 minutes, moderate  Dilation: 7 Effacement (%): 80, edematous? Cervical Position: Posterior Station: -1 Presentation: Vertex Exam by:: Karen Soto  Labs: Results for orders placed or performed during the hospital encounter of 08/09/15 (from the past 24 hour(s))  MRSA PCR Screening     Status: None   Collection Time: 08/10/15  8:00 AM  Result Value Ref Range   MRSA by PCR NEGATIVE NEGATIVE    Assessment / Plan: 3575w2d week IUP Labor: Transition Fetal Wellbeing:  Category I Pain Control:  hydrotherapy Anticipated MOD:  SVD Consider restarting nipple stim if no change at next exam.   Karen [place] Erryn Soto, CNM 08/10/2015 3:22 PM

## 2015-08-10 NOTE — Anesthesia Procedure Notes (Signed)
Epidural Patient location during procedure: OB  Preanesthetic Checklist Completed: patient identified, site marked, surgical consent, pre-op evaluation, timeout performed, IV checked, risks and benefits discussed and monitors and equipment checked  Epidural Patient position: sitting Prep: site prepped and draped and DuraPrep Patient monitoring: continuous pulse ox and blood pressure Approach: midline Location: L3-L4 Injection technique: LOR air  Needle:  Needle type: Tuohy  Needle gauge: 17 G Needle length: 9 cm and 9 Needle insertion depth: 9 cm Catheter type: closed end flexible Catheter size: 19 Gauge Catheter at skin depth: 18 cm Test dose: negative  Assessment Events: blood not aspirated, injection not painful, no injection resistance, negative IV test and no paresthesia  Additional Notes Dosing of Epidural:  1st dose, through catheter .............................................  Xylocaine 40 mg  2nd dose, through catheter, after waiting 3 minutes.........Xylocaine 60 mg    As each dose occurred, patient was free of IV sx; and patient exhibited no evidence of SA injection.  Patient is more comfortable after epidural dosed. Please see RN's note for documentation of vital signs,and FHR which are stable.  Patient reminded not to try to ambulate with numb legs, and that an RN must be present when she attempts to get up.        

## 2015-08-11 ENCOUNTER — Inpatient Hospital Stay (HOSPITAL_COMMUNITY): Admission: RE | Admit: 2015-08-11 | Payer: Medicaid Other | Source: Ambulatory Visit

## 2015-08-11 MED ORDER — RHO D IMMUNE GLOBULIN 1500 UNIT/2ML IJ SOSY
300.0000 ug | PREFILLED_SYRINGE | Freq: Once | INTRAMUSCULAR | Status: AC
  Administered 2015-08-11: 300 ug via INTRAVENOUS
  Filled 2015-08-11: qty 2

## 2015-08-11 MED ORDER — LANOLIN HYDROUS EX OINT: TOPICAL_OINTMENT | CUTANEOUS | Status: DC | PRN

## 2015-08-11 MED ORDER — ONDANSETRON HCL 4 MG/2ML IJ SOLN: 4.0000 mg | INTRAMUSCULAR | Status: DC | PRN

## 2015-08-11 MED ORDER — BENZOCAINE-MENTHOL 20-0.5 % EX AERO: 1.0000 "application " | INHALATION_SPRAY | CUTANEOUS | Status: DC | PRN

## 2015-08-11 MED ORDER — PRENATAL MULTIVITAMIN CH
1.0000 | ORAL_TABLET | Freq: Every day | ORAL | Status: DC
  Administered 2015-08-11 – 2015-08-12 (×2): 1 via ORAL
  Filled 2015-08-11 (×2): qty 1

## 2015-08-11 MED ORDER — OXYCODONE-ACETAMINOPHEN 5-325 MG PO TABS: 2.0000 | ORAL_TABLET | ORAL | Status: DC | PRN

## 2015-08-11 MED ORDER — WITCH HAZEL-GLYCERIN EX PADS: 1.0000 "application " | MEDICATED_PAD | CUTANEOUS | Status: DC | PRN

## 2015-08-11 MED ORDER — TETANUS-DIPHTH-ACELL PERTUSSIS 5-2.5-18.5 LF-MCG/0.5 IM SUSP: 0.5000 mL | Freq: Once | INTRAMUSCULAR | Status: DC

## 2015-08-11 MED ORDER — IBUPROFEN 600 MG PO TABS
600.0000 mg | ORAL_TABLET | Freq: Four times a day (QID) | ORAL | Status: DC
  Administered 2015-08-11 – 2015-08-12 (×7): 600 mg via ORAL
  Filled 2015-08-11 (×7): qty 1

## 2015-08-11 MED ORDER — DIPHENHYDRAMINE HCL 25 MG PO CAPS: 25.0000 mg | ORAL_CAPSULE | Freq: Four times a day (QID) | ORAL | Status: DC | PRN

## 2015-08-11 MED ORDER — ONDANSETRON HCL 4 MG PO TABS: 4.0000 mg | ORAL_TABLET | ORAL | Status: DC | PRN

## 2015-08-11 MED ORDER — ZOLPIDEM TARTRATE 5 MG PO TABS: 5.0000 mg | ORAL_TABLET | Freq: Every evening | ORAL | Status: DC | PRN

## 2015-08-11 MED ORDER — OXYCODONE-ACETAMINOPHEN 5-325 MG PO TABS: 1.0000 | ORAL_TABLET | ORAL | Status: DC | PRN

## 2015-08-11 MED ORDER — DIBUCAINE 1 % RE OINT: 1.0000 "application " | TOPICAL_OINTMENT | RECTAL | Status: DC | PRN

## 2015-08-11 MED ORDER — SENNOSIDES-DOCUSATE SODIUM 8.6-50 MG PO TABS
2.0000 | ORAL_TABLET | ORAL | Status: DC
  Administered 2015-08-11: 2 via ORAL
  Filled 2015-08-11: qty 2

## 2015-08-11 MED ORDER — ACETAMINOPHEN 325 MG PO TABS: 650.0000 mg | ORAL_TABLET | ORAL | Status: DC | PRN

## 2015-08-11 MED ORDER — SIMETHICONE 80 MG PO CHEW: 80.0000 mg | CHEWABLE_TABLET | ORAL | Status: DC | PRN

## 2015-08-11 NOTE — Anesthesia Postprocedure Evaluation (Signed)
Anesthesia Post Note  Patient: Karen Soto  Procedure(s) Performed: * No procedures listed *  Patient location during evaluation: Mother Baby Anesthesia Type: Epidural Level of consciousness: awake and alert Pain management: satisfactory to patient Vital Signs Assessment: post-procedure vital signs reviewed and stable Respiratory status: respiratory function stable Cardiovascular status: stable Postop Assessment: no headache, no backache, epidural receding, patient able to bend at knees, no signs of nausea or vomiting and adequate PO intake Anesthetic complications: no Comments: Comfort level was assessed by AnesthesiaTeam and the patient was pleased with the care, interventions, and services provided by the Department of Anesthesia.    Last Vitals:  Filed Vitals:   08/11/15 0110 08/11/15 0520  BP: 125/56 125/60  Pulse: 102 78  Temp: 36.7 C 36.7 C  Resp: 18 18    Last Pain:  Filed Vitals:   08/11/15 0806  PainSc: 0-No pain                 [name]

## 2015-08-11 NOTE — Progress Notes (Signed)
PPD#1 Subjective: Karen Soto is a 25 y.o. G1P1001 PPD#1 s/p SVD at 7013w2d. Pt is eating, drinking, voiding, ambulating without problems. She has not had a bowel movement. Her pain is controlled with ibuprofen. Lochia is appropriate, uterus is firm, no calf tenderness. She had a baby boy and plans on outpt circumcision. She is breastfeeding.  Objective: BP 125/60 mmHg  Pulse 78  Temp(Src) 98 F (36.7 C) (Oral)  Resp 18  Ht 5\' 9"  (1.753 m)  Wt 124.739 kg (275 lb)  BMI 40.59 kg/m2  SpO2 98%  LMP 10/25/2014 (Approximate)  Breastfeeding? Unknown    Gen: Alert and oriented CV: RRR Pulm: CTAB Abdom: Uterus firm Extremities: No swelling, calf tenderness. Negative Homan's sign.  Labs: Lab Results  Component Value Date   WBC 14.3* 08/09/2015   HGB 12.4 08/09/2015   HCT 36.7 08/09/2015   MCV 79.8 08/09/2015   PLT 239 08/09/2015    Assessment Karen Soto is a 25 y.o. G1P1001 PPD#1 s/p SVD at 6213w2d, doing well. Pt is Rh- with Rh+ baby and needs another Rhogam shot.  Plan: -Administer another Rhogam injection -Discharge home tomorrow  Felton ClintonKali Xu, Med Student 08/11/2015, 7:43 AM

## 2015-08-11 NOTE — Lactation Note (Signed)
This note was copied from a baby's chart. Lactation Consultation Note: Lactation Brochure given to mother. Mother has semi-flat nipple tissue. Infant has fed short on and off feedings. Assist mother with tea-cup hold. Infant sustained latch for 20 mins. Infant was spoon fed 5 ml. Infant latched to alternate breast for another 10 mins. Mother was taught to firm nipple with fingers. Mother receptive to all teaching. Advised mother to feed every 2-3 hours and at least 8-12 times in 24 hours. Mother advised to do frequent STS.  Patient Name: Boy Karen Soto [id number]'s Date: 08/11/2015 Reason for consult: Follow-up assessment   Maternal Data    Feeding Feeding Type: Breast Fed Length of feed: 20 min  LATCH Score/Interventions Latch: Grasps breast easily, tongue down, lips flanged, rhythmical sucking.  Audible Swallowing: Spontaneous and intermittent  Type of Nipple: Flat  Comfort (Breast/Nipple): Soft / non-tender     Hold (Positioning): Assistance needed to correctly position infant at breast and maintain latch. Intervention(s): Breastfeeding basics reviewed;Support Pillows;Position options;Skin to skin  LATCH Score: 8  Lactation Tools Discussed/Used     Consult Status Consult Status: Follow-up Date: 08/11/15 Follow-up type: In-patient    Stevan BornKendrick, Trindon Dorton [facility] 08/11/2015, 4:15 PM

## 2015-08-12 LAB — RH IG WORKUP (INCLUDES ABO/RH)
ABO/RH(D): O NEG
FETAL SCREEN: NEGATIVE
GESTATIONAL AGE(WKS): 41
Unit division: 0

## 2015-08-12 NOTE — Lactation Note (Signed)
This note was copied from a baby's chart. Lactation Consultation Note  Patient Name: Karen Soto [id number]'s Date: 08/12/2015 Reason for consult: Follow-up assessment   Follow up with mom of 35 hour old infant. Infant with 7 BF for 10-60 minutes, 1 spoon feeding of 5 cc EBM, 3 voids and 3 stool in last 24 hours. LATCH Scores 7-8 by bedside RN. Infant weight 6 lb 8.9 oz with weight loss of 3% since birth.  Infant was on the right breast in the cross cradle hold when I went into the room. Infant was supported well with pillows and at the level of the breast. Infant was nursing rhythmically and noted to have intermittent swallows. Mom was giving breathing space, enc her to massage/compress breast with feeding and discussed plugged ducts with mom.  Mom with large fleshy breast and soft compressible areola. Infant relatched a few times while I was in room using teacup hold.  Mom denies nipple pain and denies any breast changes since delivery. Enc mom to continue to feed infant 8-12 x in 24 hours at first feeding cues, awakening infant as needed while at breast.   Reviewed all BF information in Taking Care of Baby and Me Booklet. Reviewed Engorgement prevention/treatment/comfort pumping with mom. Mom has a manual pump for home use. Reviewed I/O and enc parents to maintain feeding log and take to Ped visit. Parents have not made a f/u Ped visit yet, they are working on it. [facility] and plans to call and make a f/u appt after d/c.   LC Brochure reviewed, mom aware of BF Support Groups, OP Services, and LC Phone #. Enc mom to call with questions/concerns prn.    Maternal Data Has patient been taught Hand Expression?: Yes Does the patient have breastfeeding experience prior to this delivery?: No  Feeding Feeding Type: Breast Fed Length of feed: 15 min  LATCH Score/Interventions Latch: Grasps breast easily, tongue down, lips flanged, rhythmical sucking. Intervention(s): Adjust  position;Assist with latch;Breast massage;Breast compression  Audible Swallowing: A few with stimulation Intervention(s): Alternate breast massage;Hand expression  Type of Nipple: Flat (Everts with stimulation) Intervention(s): Hand pump  Comfort (Breast/Nipple): Soft / non-tender     Hold (Positioning): No assistance needed to correctly position infant at breast. Intervention(s): Breastfeeding basics reviewed;Support Pillows;Position options;Skin to skin  LATCH Score: 8  Lactation Tools Discussed/Used WIC Program: Yes Pump Review: Setup, frequency, and cleaning;Milk Storage   Consult Status Consult Status: Complete Follow-up type: Call as needed    Ed BlalockSharon S Rockie Schnoor 08/12/2015, 9:08 AM

## 2015-08-12 NOTE — Progress Notes (Signed)
UR chart review completed.  

## 2015-08-12 NOTE — Discharge Summary (Signed)
OB Discharge Summary     Patient Name: Karen Soto DOB: 11-09-1990 MRN: [id number]  Date of admission: 08/09/2015 Delivering MD: Cam Hai D   Date of discharge: 08/12/2015  Admitting diagnosis: 41WKS,LEAKING,BLEEDING Intrauterine pregnancy: [redacted]w[redacted]d     Secondary diagnosis:  Active Problems:   Active labor   Rh negative state in antepartum period   SVD (spontaneous vaginal delivery)  Additional problems:      Discharge diagnosis: Term Pregnancy Delivered                                                                                                Post partum procedures:rhogam  Augmentation: Pitocin, Cytotec and Foley Balloon  Complications: None  Hospital course:  Onset of Labor With Vaginal Delivery     25 y.o. yo G1P1001 at [redacted]w[redacted]d was admitted for PROM and in Latent Labor on 08/09/2015. Patient had an uncomplicated labor course as follows:  Membrane Rupture Time/Date: 8:00 AM ,08/09/2015   Attempted water birth with cervical ripening Moved to bed as epidural was placed Pit added prior to delivery Intrapartum Procedures: Episiotomy: None [1]                                         Lacerations:  None [1]  Patient had a delivery of a Viable infant. 08/10/2015  Information for the patient's newborn:  Brandey, Vandalen [[id number]]  Delivery Method: Vag-Spont    Pateint had an uncomplicated postpartum course.  Patient did receive rhogam as infant was Rh+.  She is ambulating, tolerating a regular diet, passing flatus, and urinating well. Patient is discharged home in stable condition on 08/12/2015.    Physical exam  Filed Vitals:   08/11/15 0801 08/11/15 1200 08/11/15 1800 08/12/15 0449  BP: 126/60 122/64 131/66 129/62  Pulse: 70 74 90 79  Temp: 98 F (36.7 C) 98.4 F (36.9 C) 98.3 F (36.8 C) 97.6 F (36.4 C)  TempSrc: Oral Oral Oral Oral  Resp: Height:      Weight:      SpO2: 98% 99% 98%    General: alert Lochia: appropriate Uterine  Fundus: firm Incision: N/A DVT Evaluation: No evidence of DVT seen on physical exam. Labs: Lab Results  Component Value Date   WBC 14.3* 08/09/2015   HGB 12.4 08/09/2015   HCT 36.7 08/09/2015   MCV 79.8 08/09/2015   PLT 239 08/09/2015   CMP Latest Ref Rng 12/22/2014  Glucose 65 - 99 mg/dL 87  BUN 6 - 20 mg/dL 7  Creatinine 4.74 - 2.59 mg/dL 5.63  Sodium 875 - 643 mmol/L 134(L)  Potassium 3.5 - 5.1 mmol/L 3.6  Chloride 101 - 111 mmol/L 104  CO2 22 - 32 mmol/L 21(L)  Calcium 8.9 - 10.3 mg/dL 9.1  Total Protein 6.5 - 8.1 g/dL 6.9  Total Bilirubin 0.3 - 1.2 mg/dL 0.4  Alkaline Phos 38 - 126 U/L 56  AST 15 - 41 U/L 18  ALT 14 - 54 U/L 17  Discharge instruction: per After Visit Summary and "Baby and Me Booklet".  After visit meds:    Medication List    ASK your doctor about these medications        amoxicillin 500 MG capsule  Commonly known as:  AMOXIL  Take 1 capsule (500 mg total) by mouth 2 (two) times daily.     azithromycin 500 MG tablet  Commonly known as:  ZITHROMAX  Take 2 tablets (1,000 mg total) by mouth once.        Diet: routine diet  Activity: Advance as tolerated. Pelvic rest for 6 weeks.   Outpatient follow up:6 weeks Follow up Appt:Future Appointments Date Time Provider Department Center  09/11/2015 2:30 PM Levie HeritageJacob J Stinson, DO [facility] None   Follow up Visit:No Follow-up on file.  Postpartum contraception: Condoms  Newborn Data: Live born female  Birth Weight: 6 lb 12.3 oz (3070 g) APGAR: 8, 9  Baby Feeding: Breast Disposition:home with mother   08/12/2015 Michael J [place], MD

## 2015-08-13 LAB — TYPE AND SCREEN
ABO/RH(D): O NEG
Antibody Screen: POSITIVE
DAT, IGG: NEGATIVE
UNIT DIVISION: 0
UNIT DIVISION: 0

## 2015-08-29 ENCOUNTER — Telehealth: Payer: Self-pay | Admitting: Behavioral Health

## 2015-08-29 NOTE — Telephone Encounter (Signed)
Unable to reach patient at time of Pre-Visit Call.  Left message for patient to return call when available.    

## 2015-08-30 ENCOUNTER — Encounter: Payer: Self-pay | Admitting: Medical

## 2015-08-30 ENCOUNTER — Ambulatory Visit (INDEPENDENT_AMBULATORY_CARE_PROVIDER_SITE_OTHER): Payer: Medicaid Other | Admitting: Medical

## 2015-08-30 ENCOUNTER — Ambulatory Visit: Payer: Medicaid Other | Admitting: Medical

## 2015-08-30 VITALS — BP 114/80 | HR 78 | Temp 98.4°F | Ht 69.0 in | Wt 269.2 lb

## 2015-08-30 DIAGNOSIS — Z8669 Personal history of other diseases of the nervous system and sense organs: Secondary | ICD-10-CM | POA: Diagnosis not present

## 2015-08-30 DIAGNOSIS — R5383 Other fatigue: Secondary | ICD-10-CM | POA: Diagnosis not present

## 2015-08-30 DIAGNOSIS — Z8709 Personal history of other diseases of the respiratory system: Secondary | ICD-10-CM

## 2015-08-30 DIAGNOSIS — E669 Obesity, unspecified: Secondary | ICD-10-CM | POA: Diagnosis not present

## 2015-08-30 DIAGNOSIS — Z111 Encounter for screening for respiratory tuberculosis: Secondary | ICD-10-CM

## 2015-08-30 LAB — CBC WITH DIFFERENTIAL/PLATELET
BASOS ABS: 71 {cells}/uL (ref 0–200)
Basophils Relative: 1 %
EOS ABS: 284 {cells}/uL (ref 15–500)
EOS PCT: 4 %
HEMATOCRIT: 39.8 % (ref 35.0–45.0)
HEMOGLOBIN: 12.6 g/dL (ref 11.7–15.5)
LYMPHS ABS: 2485 {cells}/uL (ref 850–3900)
Lymphocytes Relative: 35 %
MCH: 25.8 pg — AB (ref 27.0–33.0)
MCHC: 31.7 g/dL — AB (ref 32.0–36.0)
MCV: 81.4 fL (ref 80.0–100.0)
MPV: 10.6 fL (ref 7.5–12.5)
Monocytes Absolute: 426 cells/uL (ref 200–950)
Monocytes Relative: 6 %
NEUTROS ABS: 3834 {cells}/uL (ref 1500–7800)
NEUTROS PCT: 54 %
Platelets: 274 10*3/uL (ref 140–400)
RBC: 4.89 MIL/uL (ref 3.80–5.10)
RDW: 14.5 % (ref 11.0–15.0)
WBC: 7.1 10*3/uL (ref 3.8–10.8)

## 2015-08-30 LAB — TSH: TSH: 1.15 m[IU]/L

## 2015-08-30 NOTE — Patient Instructions (Addendum)
Congratulation on  birth of your child.  Recommend diet and exercise for weight loss.  For fatigue we can get tsh today. Although as we discussed fatigue likely from sleep deprivation associated with child awakening at night. But getting tsh good idea in event some hypothryoidism. I couldl not find tsh done in recent labs.  If you have any recurrent migraines or asthma flare let us know.  See gyn for implanon and for your regular pap.  Follow up in 3-6 months or as needed

## 2015-08-30 NOTE — Progress Notes (Signed)
Pre visit review using our clinic review tool, if applicable. No additional management support is needed unless otherwise documented below in the visit note. 

## 2015-08-30 NOTE — Progress Notes (Signed)
Subjective:    Patient ID: Karen Soto, female    DOB: 02-Apr-1991, 25 y.o.   MRN: [id number]  HPI  I have reviewed pt PMH, PSH, FH, Social History and Surgical History.  Pt is mom, she also works at daycare full term, Pt not exercising but tried, No caffeinated beverages, Eating moderate healthy, she is breast feeding, single.  States when she was doing yoga had lower back pain and lower abd cramps. She tries to do again.  Pt delivered boy 3 weeks. He was full term. He is not sleeping and mom not sleeping.  Pt states 3 our glucose tolerance test came back normal during her pregnancy.  Pt had history of exercise induced asthma. [facility] attended. Played softball.since stopped softball had no asthma. Pt studied early child hood intervention.  Hx of migraines 2 years. Describes nauseau, light sensitivity, and sound sensitivity with ha. She would sleep ha off.  One concussion when she was 25 yo. No significant postconcussion period. Got over quickly.  Hx of obesity. Prior to pregnancy was weighting 208.   Pt has plans to get implanon with Dr. Gust Rung.    Review of Systems  Constitutional: Positive for fatigue. Negative for chills.       But not sleeping much with new born.  HENT: Negative for congestion, ear pain, postnasal drip and sinus pressure.   Respiratory: Negative for cough, shortness of breath and wheezing.   Cardiovascular: Negative for chest pain and palpitations.  Gastrointestinal: Negative for abdominal pain.       See hpi. Some lower abd pain dong yoga.  Musculoskeletal: Negative for back pain.       Some low back pain when doing yoga.  Neurological: Negative for dizziness, syncope, weakness, numbness and headaches.  Hematological: Negative for adenopathy. Does not bruise/bleed easily.  Psychiatric/Behavioral: Negative for behavioral problems and confusion.    Past Medical History  Diagnosis Date  . Asthma   . Drooping eyelid 01/04/2012    right   . Headache(784.0) 01/08/2012    "lots since Monday (01/04/2012)"  . Obesity   . Chlamydia     Social History   Social History  . Marital Status: Single    Spouse Name: N/A  . Number of Children: N/A  . Years of Education: N/A   Occupational History  . Not on file.   Social History Main Topics  . Smoking status: Never Smoker   . Smokeless tobacco: Never Used  . Alcohol Use: No     Comment: occ  . Drug Use: No     Comment: last used marijuana when pregnant but was not aware of pregnancy.  none since aware of pregnancy approx 04/2015  . Sexual Activity: Yes   Other Topics Concern  . Not on file   Social History Narrative   ** Merged History Encounter **        Past Surgical History  Procedure Laterality Date  . Appendectomy  01/08/2012  . Incision and drainage of wound  ~ 2010    "MRSA in her back; had it drained @ the hospital"  . Laparoscopic appendectomy  01/08/2012    Procedure: APPENDECTOMY LAPAROSCOPIC;  Surgeon: Robyne Askew, MD;  Location: [facility];  Service: General;  Laterality: N/A;  . Appendectomy      Family History  Problem Relation Age of Onset  . Hypertension Mother   . Stroke Mother   . Cancer Father     skin  . Prostate cancer  Father   . Diabetes Paternal Grandmother     No Known Allergies  No current outpatient prescriptions on file prior to visit.   Current Facility-Administered Medications on File Prior to Visit  Medication Dose Route Frequency Provider Last Rate Last Dose  . lidocaine (PF) (XYLOCAINE) 1 % injection    Anesthesia Intra-op Cristela BlueKyle Jackson, MD   6 mL at 08/10/15 1815    BP 114/80 mmHg  Pulse 78  Temp(Src) 98.4 F (36.9 C) (Oral)  Ht 5\' 9"  (1.753 m)  Wt 269 lb 3.2 oz (122.108 kg)  BMI 39.74 kg/m2  SpO2 98%       Objective:   Physical Exam  General Mental Status- Alert. General Appearance- Not in acute distress.   Skin General: Color- Normal Color. Moisture- Normal Moisture.  Neck Carotid Arteries- Normal  color. Moisture- Normal Moisture. No carotid bruits. No JVD.  Chest and Lung Exam Auscultation: Breath Sounds:-Normal.  Cardiovascular Auscultation:Rythm- Regular. Murmurs & Other Heart Sounds:Auscultation of the heart reveals- No Murmurs.  Abdomen Inspection:-Inspeection Normal. Palpation/Percussion:Note:No mass. Palpation and Percussion of the abdomen reveal- Non Tender, Non Distended + BS, no rebound or guarding.    Neurologic Cranial Nerve exam:- CN III-XII intact(No nystagmus), symmetric smile. Strength:- 5/5 equal and symmetric strength both upper and lower extremities.      Assessment & Plan:  Congratulation on  birth of your child.  Recommend diet and exercise for weight loss.  For fatigue we can get tsh today. Although as we discussed fatigue likely from sleep deprivation associated with child awakening at night. But getting tsh good idea in event some hypothryoidism. I couldl not find tsh done in recent labs.  If you have any recurrent migraines or asthma flare let us know.  See gyn for implanon and for your regular pap.  Follow up in 3-6 months or as needed  Note with pt obesity and mild sugar elevation in past when pregnant. Discussed possible use of metformin for weight loss in future.

## 2015-08-30 NOTE — Addendum Note (Signed)
Addended by: Harley AltoPRICE, Timon Geissinger M on: 08/30/2015 02:28 PM   Modules accepted: Orders

## 2015-09-02 ENCOUNTER — Telehealth: Payer: Self-pay | Admitting: Medical

## 2015-09-02 LAB — QUANTIFERON TB GOLD ASSAY (BLOOD)
INTERFERON GAMMA RELEASE ASSAY: NEGATIVE
Mitogen-Nil: >10 IU/mL
QUANTIFERON TB AG MINUS NIL: 0 [IU]/mL
Quantiferon Nil Value: 0.02 IU/mL

## 2015-09-02 NOTE — Telephone Encounter (Signed)
Left message for pt to call back  °

## 2015-09-02 NOTE — Telephone Encounter (Signed)
Can be reached: [phone] Pharmacy: [id number] - [place], [place] - [address]  Reason for call: Pt wanting to know if ok to start metformin since she is breastfeeding (recommended to start taking by Ramon DredgeEdward 4/7). Please call her.

## 2015-09-04 NOTE — Telephone Encounter (Signed)
Letter mailed to pt with results 09/04/15.

## 2015-09-11 ENCOUNTER — Ambulatory Visit: Payer: Self-pay | Admitting: Family Medicine

## 2015-09-19 ENCOUNTER — Ambulatory Visit: Payer: Self-pay | Admitting: Obstetrics & Gynecology

## 2015-09-19 ENCOUNTER — Other Ambulatory Visit (HOSPITAL_COMMUNITY)
Admission: RE | Admit: 2015-09-19 | Discharge: 2015-09-19 | Disposition: A | Discharge status: Home / Self Care | Payer: Medicaid Other | Source: Ambulatory Visit | Attending: Obstetrics & Gynecology | Admitting: Obstetrics & Gynecology

## 2015-09-19 ENCOUNTER — Ambulatory Visit (INDEPENDENT_AMBULATORY_CARE_PROVIDER_SITE_OTHER): Payer: Medicaid Other | Admitting: Obstetrics & Gynecology

## 2015-09-19 ENCOUNTER — Encounter: Payer: Self-pay | Admitting: Obstetrics & Gynecology

## 2015-09-19 VITALS — BP 135/79 | HR 77 | Resp 16 | Ht 68.0 in | Wt 267.0 lb

## 2015-09-19 DIAGNOSIS — Z01419 Encounter for gynecological examination (general) (routine) without abnormal findings: Secondary | ICD-10-CM | POA: Diagnosis not present

## 2015-09-19 DIAGNOSIS — Z0000 Encounter for general adult medical examination without abnormal findings: Secondary | ICD-10-CM

## 2015-09-19 DIAGNOSIS — Z01812 Encounter for preprocedural laboratory examination: Secondary | ICD-10-CM | POA: Diagnosis not present

## 2015-09-19 DIAGNOSIS — Z30013 Encounter for initial prescription of injectable contraceptive: Secondary | ICD-10-CM

## 2015-09-19 LAB — POCT URINE PREGNANCY: Preg Test, Ur: NEGATIVE

## 2015-09-19 MED ORDER — MEDROXYPROGESTERONE ACETATE 150 MG/ML IM SUSP
150.0000 mg | Freq: Once | INTRAMUSCULAR | Status: AC
  Administered 2015-09-19: 150 mg via INTRAMUSCULAR

## 2015-09-19 NOTE — Progress Notes (Signed)
  Subjective:     Karen Soto is a 25 y.o. S AA P1 female who presents for a postpartum visit. She is 6 weeks postpartum following a spontaneous vaginal delivery. I have fully reviewed the prenatal and intrapartum course. The delivery was at 41 gestational weeks. Outcome: spontaneous vaginal delivery. Anesthesia: epidural. Postpartum course has been normal. Baby's course has been normal. Baby is feeding by breast. Bleeding no bleeding. Bowel function is normal. Bladder function is normal. Patient is not sexually active. Contraception method is Depo-Provera injections. Postpartum depression screening: negative. She had an IUD for 1 day prior to having it removed due to pain. She used the depo provera in the past and DID NOT gain any weight until after she stopped the depo provera. She had considered the Nexplanon but had many friends that were unhappy with it so she has decided against it. She did not like OCPs in the past.  The following portions of the patient's history were reviewed and updated as appropriate: allergies, current medications, past family history, past medical history, past social history, past surgical history and problem list.  Review of Systems Pertinent items are noted in HPI.   Objective:    BP 135/79 mmHg  Pulse 77  Resp 16  Ht 5\' 8"  (1.727 m)  Wt 267 lb (121.11 kg)  BMI 40.61 kg/m2  Breastfeeding? Yes  General:  alert   Breasts:  inspection negative, no nipple discharge or bleeding, no masses or nodularity palpable  Lungs: clear to auscultation bilaterally  Heart:  regular rate and rhythm, S1, S2 normal, no murmur, click, rub or gallop  Abdomen: soft, non-tender; bowel sounds normal; no masses,  no organomegaly   Vulva:  not evaluated  Vagina: normal vagina  Cervix:  anteverted  Corpus: normal  Adnexa:  normal adnexa  Rectal Exam: Not performed.        Assessment:     Normal postpartum exam. Pap smear done at today's visit.   Plan:    1. Contraception:  Depo-Provera injections today and every 12 weeks

## 2015-09-20 LAB — CYTOLOGY - PAP

## 2015-10-14 ENCOUNTER — Ambulatory Visit: Payer: Self-pay | Admitting: Obstetrics & Gynecology

## 2015-10-16 ENCOUNTER — Encounter (HOSPITAL_BASED_OUTPATIENT_CLINIC_OR_DEPARTMENT_OTHER): Payer: Self-pay

## 2015-10-16 ENCOUNTER — Emergency Department (HOSPITAL_BASED_OUTPATIENT_CLINIC_OR_DEPARTMENT_OTHER)
Admission: EM | Admit: 2015-10-16 | Discharge: 2015-10-16 | Disposition: A | Discharge status: Home / Self Care | Payer: Medicaid Other | Attending: Emergency Medicine | Admitting: Emergency Medicine

## 2015-10-16 DIAGNOSIS — Z6841 Body Mass Index (BMI) 40.0 and over, adult: Secondary | ICD-10-CM | POA: Diagnosis not present

## 2015-10-16 DIAGNOSIS — J45909 Unspecified asthma, uncomplicated: Secondary | ICD-10-CM | POA: Diagnosis not present

## 2015-10-16 DIAGNOSIS — H9202 Otalgia, left ear: Secondary | ICD-10-CM | POA: Diagnosis not present

## 2015-10-16 DIAGNOSIS — E669 Obesity, unspecified: Secondary | ICD-10-CM | POA: Diagnosis not present

## 2015-10-16 MED ORDER — CETIRIZINE-PSEUDOEPHEDRINE ER 5-120 MG PO TB12: 1.0000 | ORAL_TABLET | Freq: Every day | ORAL | Status: DC

## 2015-10-16 NOTE — Discharge Instructions (Signed)
Return to the ED with any concerns including fever, difficulty breathing, neck stiffness or pain, or any other alarming symptoms

## 2015-10-16 NOTE — ED Provider Notes (Signed)
CSN: [id number]     Arrival date & time 10/16/15  1853 History   By signing my name below, I, Linus GalasMaharshi Patel, attest that this documentation has been prepared under the direction and in the presence of No att. providers found. Electronically Signed: Linus GalasMaharshi Patel, ED Scribe. 10/16/2015. 8:23 PM.   Chief Complaint  Patient presents with  . Otalgia   The history is provided by the patient. No language interpreter was used.   HPI Comments: Karen Soto is a 25 y.o. female with no pertinent PMHx, who presents to the Emergency Department complaining of left ear pain that began 3 days ago. She states her symptoms began with nasal congestion that was relieved however it was followed by left ear pain. Pt had an ear infection 3 months ago and has been several months without antibiotics. Pt denies any fevers, chills, CP, SOB, N/V/D or any other symptoms at this time. NKDA  Pt breast feeds.   Past Medical History  Diagnosis Date  . Asthma   . Drooping eyelid 01/04/2012    right  . Headache(784.0) 01/08/2012    "lots since Monday (01/04/2012)"  . Obesity   . Chlamydia    Past Surgical History  Procedure Laterality Date  . Appendectomy  01/08/2012  . Incision and drainage of wound  ~ 2010    "MRSA in her back; had it drained @ the hospital"  . Laparoscopic appendectomy  01/08/2012    Procedure: APPENDECTOMY LAPAROSCOPIC;  Surgeon: Robyne AskewPaul S Toth III, MD;  [facility];  Service: General;  Laterality: N/A;  . Appendectomy     Family History  Problem Relation Age of Onset  . Hypertension Mother   . Stroke Mother   . Cancer Father     skin  . Prostate cancer Father   . Diabetes Paternal Grandmother    Social History  Substance Use Topics  . Smoking status: Never Smoker   . Smokeless tobacco: Never Used  . Alcohol Use: Yes     Comment: occ   OB History    Gravida Para Term Preterm AB TAB SAB Ectopic Multiple Living   1 1 1  0 0 0 0 0 0 1     Review of Systems  Constitutional:  Negative for fever and chills.  HENT: Positive for ear pain.   Respiratory: Negative for shortness of breath.   Cardiovascular: Negative for chest pain.  Gastrointestinal: Negative for nausea, vomiting and diarrhea.  All other systems reviewed and are negative.  Allergies  Review of patient's allergies indicates no known allergies.  Home Medications   Prior to Admission medications   Medication Sig Start Date End Date Taking? Authorizing Provider  cetirizine-pseudoephedrine (ZYRTEC-D) 5-120 MG tablet Take 1 tablet by mouth daily. 10/16/15   Jerelyn ScottMartha Linker, MD   BP 134/83 mmHg  Pulse 68  Temp(Src) 98.2 F (36.8 C) (Oral)  Resp 18  Ht 5\' 8"  (1.727 m)  Wt 263 lb 14.4 oz (119.704 kg)  BMI 40.14 kg/m2  SpO2 100%  Vitals reviewed Physical Exam  Physical Examination: General appearance - alert, well appearing, and in no distress Mental status - alert, oriented to person, place, and time Eyes - no conjunctival injection no scleral icterus Ears - bilateral TM's and external ear canals normal Mouth - mucous membranes moist, pharynx normal without lesions Neck - supple, no significant adenopathy Chest - clear to auscultation, no wheezes, rales or rhonchi, symmetric air entry Neurological - alert, oriented, normal speech Extremities - peripheral pulses normal, no  pedal edema, no clubbing or cyanosis Skin - normal coloration and turgor, no rashe  ED Course  Procedures  DIAGNOSTIC STUDIES: Oxygen Saturation is 100% on room air, normal by my interpretation.    COORDINATION OF CARE: 7:56 PM Discussed treatment plan with pt at bedside and pt agreed to plan.  Labs Review Labs Reviewed - No data to display  Imaging Review No results found. I have personally reviewed and evaluated these images and lab results as part of my medical decision-making.   EKG Interpretation None      MDM   Final diagnoses:  Otalgia of left ear    Pt presenting with pain in left ear, she has had  some URI symptoms and is concerned she may have ear infection.  On exam TM  Is normal bilaterally, some clear fluid behind TM, no otitis media seen.  Advised zyrtec D to help dry fluid.  Recheck with her PMD if pain continues or she spikes fever.  Discharged with strict return precautions.  Pt agreeable with plan.  I personally performed the services described in this documentation, which was scribed in my presence. The recorded information has been reviewed and is accurate.     Jerelyn Scott, MD 10/16/15 [phone]

## 2015-10-16 NOTE — ED Notes (Signed)
Left ear ache x 3 days-NAD-steady gait

## 2015-10-29 ENCOUNTER — Encounter: Payer: Self-pay | Admitting: Medical

## 2015-10-29 ENCOUNTER — Ambulatory Visit (INDEPENDENT_AMBULATORY_CARE_PROVIDER_SITE_OTHER): Payer: Medicaid Other | Admitting: Medical

## 2015-10-29 VITALS — BP 127/76 | HR 84 | Temp 98.6°F | Ht 68.0 in | Wt 259.0 lb

## 2015-10-29 DIAGNOSIS — L089 Local infection of the skin and subcutaneous tissue, unspecified: Secondary | ICD-10-CM | POA: Diagnosis not present

## 2015-10-29 DIAGNOSIS — L299 Pruritus, unspecified: Secondary | ICD-10-CM

## 2015-10-29 MED ORDER — DOXYCYCLINE HYCLATE 100 MG PO TABS: 100.0000 mg | ORAL_TABLET | Freq: Two times a day (BID) | ORAL | Status: DC

## 2015-10-29 MED ORDER — PREDNISONE 10 MG PO TABS: ORAL_TABLET | ORAL | Status: DC

## 2015-10-29 MED ORDER — HYDROXYZINE HCL 25 MG PO TABS: 25.0000 mg | ORAL_TABLET | Freq: Three times a day (TID) | ORAL | Status: DC | PRN

## 2015-10-29 NOTE — Progress Notes (Signed)
Pre visit review using our clinic tool,if applicable. No additional management support is needed unless otherwise documented below in the visit note.  

## 2015-10-29 NOTE — Patient Instructions (Addendum)
You do appear to have skin infection in 3 areas. I am prescribing doxycycline for infection. If these area worsen forming abscesses then notify us or go to ED.  By your history of scars on your skin. You might have underlying eczema or recent insect bites that might have precipitated this infection. For itching will rx low dose prednisone and hydroxyzine.  Follow up in 7 days or as needed

## 2015-10-29 NOTE — Progress Notes (Signed)
Subjective:    Patient ID: Karen Soto, female    DOB: 29-Jul-1990, 25 y.o.   MRN: [id number]  HPI  Pt in with rash on your upper thigh and latissimus dorsi areas. These are red and they hurt. They also itch. At first these area itched then they became painful. Pt can't remember being bit by anything.(rash and itching has been present for 4 days)  Pt did not put on anything. She has history of sensitive skin. She shows me various area of scars on her skin  LMP- pt last depo injection. Last week of April. No breast feeding  Review of Systems  Constitutional: Negative for fever, chills and fatigue.  Respiratory: Negative for cough, chest tightness and stridor.   Cardiovascular: Negative for chest pain and palpitations.  Skin: Positive for rash.  Psychiatric/Behavioral: Negative for behavioral problems and confusion.   Past Medical History  Diagnosis Date  . Asthma   . Drooping eyelid 01/04/2012    right  . Headache(784.0) 01/08/2012    "lots since Monday (01/04/2012)"  . Obesity   . Chlamydia      Social History   Social History  . Marital Status: Single    Spouse Name: N/A  . Number of Children: N/A  . Years of Education: N/A   Occupational History  . Not on file.   Social History Main Topics  . Smoking status: Never Smoker   . Smokeless tobacco: Never Used  . Alcohol Use: Yes     Comment: occ  . Drug Use: No  . Sexual Activity: Yes    Birth Control/ Protection: Injection   Other Topics Concern  . Not on file   Social History Narrative   ** Merged History Encounter **        Past Surgical History  Procedure Laterality Date  . Appendectomy  01/08/2012  . Incision and drainage of wound  ~ 2010    "MRSA in her back; had it drained @ the hospital"  . Laparoscopic appendectomy  01/08/2012    Procedure: APPENDECTOMY LAPAROSCOPIC;  Surgeon: Robyne AskewPaul S Toth III, MD;  [facility];  Service: General;  Laterality: N/A;  . Appendectomy      Family History    Problem Relation Age of Onset  . Hypertension Mother   . Stroke Mother   . Cancer Father     skin  . Prostate cancer Father   . Diabetes Paternal Grandmother     No Known Allergies  Current Outpatient Prescriptions on File Prior to Visit  Medication Sig Dispense Refill  . cetirizine-pseudoephedrine (ZYRTEC-D) 5-120 MG tablet Take 1 tablet by mouth daily. (Patient not taking: Reported on 10/29/2015) 30 tablet 0   No current facility-administered medications on file prior to visit.    BP 127/76 mmHg  Pulse 84  Temp(Src) 98.6 F (37 C) (Oral)  Ht 5\' 8"  (1.727 m)  Wt 259 lb (117.482 kg)  BMI 39.39 kg/m2  SpO2 99%  Breastfeeding? No       Objective:   Physical Exam   General- No acute distress. Pleasant patient. Neck- Full range of motion, no jvd Lungs- Clear, even and unlabored. Heart- regular rate and rhythm. Neurologic- CNII- XII grossly intact.   Skin- left upper thigh 2.5 cm in diameter red area that is indurated and tender. But not fluctuant. Rt latisimuss dorsi area- 3.5cm  in diameter red area that is indurated and tender. But not fluctuant. Lt flank-3.5cm  in diameter red area that is indurated and  tender. But not fluctuant.  Exam done with chaperone.     Assessment & Plan:  You do appear to have skin infection in 3 areas. I am prescribing doxycycline for infection. If these area worsen forming abscesses then notify us or go to ED.  By your history of scars on your skin. You might have underlying eczema or recent insect bites that might have precipitated this infection. For itching will rx low dose prednisone and hydroxyzine.   Leanza Shepperson, Ramon Dredge, PA-C

## 2015-11-01 ENCOUNTER — Telehealth: Payer: Self-pay

## 2015-11-01 ENCOUNTER — Ambulatory Visit (INDEPENDENT_AMBULATORY_CARE_PROVIDER_SITE_OTHER): Payer: Medicaid Other | Admitting: Medical

## 2015-11-01 ENCOUNTER — Encounter: Payer: Self-pay | Admitting: Medical

## 2015-11-01 ENCOUNTER — Telehealth: Payer: Self-pay | Admitting: Medical

## 2015-11-01 VITALS — BP 118/82 | HR 79 | Temp 98.2°F | Ht 68.0 in | Wt 262.4 lb

## 2015-11-01 DIAGNOSIS — L0390 Cellulitis, unspecified: Secondary | ICD-10-CM

## 2015-11-01 DIAGNOSIS — L0291 Cutaneous abscess, unspecified: Secondary | ICD-10-CM

## 2015-11-01 MED ORDER — SULFAMETHOXAZOLE-TRIMETHOPRIM 800-160 MG PO TABS: 1.0000 | ORAL_TABLET | Freq: Two times a day (BID) | ORAL | Status: DC

## 2015-11-01 NOTE — Progress Notes (Signed)
Pre visit review using our clinic review tool, if applicable. No additional management support is needed unless otherwise documented below in the visit note. 

## 2015-11-01 NOTE — Telephone Encounter (Signed)
Patient states that she is aware that her insurance is showing up as [employer] she has called her worker and they said it should reflect [place] Medicaid tomorrow patient is aware if this is not fixed she will be responsible for full payment for any appointment that is not pregnancy related. Patient voiced understanding

## 2015-11-01 NOTE — Progress Notes (Addendum)
Subjective:    Patient ID: Karen Soto, female    DOB: [birth year], 25 y.o.   MRN: [id number]  HPI  Pt in for follow up. Pt states skin infection area on rt flank feels better. Pt states left upper thigh area region feels worse more tender. Left flank area little more indurated. Pt has no fever, no chills or sweats.  Pt is on depomedrol. Due end of July.   Review of Systems  Constitutional: Negative for chills and fatigue.  Respiratory: Negative for cough, chest tightness and wheezing.   Cardiovascular: Negative for chest pain and palpitations.  Skin: Positive for rash.       See hpi.  Psychiatric/Behavioral: Negative for behavioral problems and confusion.    Past Medical History  Diagnosis Date  . Asthma   . Drooping eyelid 01/04/2012    right  . Headache(784.0) 01/08/2012    "lots since Monday (01/04/2012)"  . Obesity   . Chlamydia      Social History   Social History  . Marital Status: Single    Spouse Name: N/A  . Number of Children: N/A  . Years of Education: N/A   Occupational History  . Not on file.   Social History Main Topics  . Smoking status: Never Smoker   . Smokeless tobacco: Never Used  . Alcohol Use: Yes     Comment: occ  . Drug Use: No  . Sexual Activity: Yes    Birth Control/ Protection: Injection   Other Topics Concern  . Not on file   Social History Narrative   ** Merged History Encounter **        Past Surgical History  Procedure Laterality Date  . Appendectomy  01/08/2012  . Incision and drainage of wound  ~ 2010    "MRSA in her back; had it drained @ the hospital"  . Laparoscopic appendectomy  01/08/2012    Procedure: APPENDECTOMY LAPAROSCOPIC;  Surgeon: Robyne Askew, MD;  Location: [facility] OR;  Service: General;  Laterality: N/A;  . Appendectomy      Family History  Problem Relation Age of Onset  . Hypertension Mother   . Stroke Mother   . Cancer Father     skin  . Prostate cancer Father   . Diabetes Paternal Grandmother      No Known Allergies  Current Outpatient Prescriptions on File Prior to Visit  Medication Sig Dispense Refill  . doxycycline (VIBRA-TABS) 100 MG tablet Take 1 tablet (100 mg total) by mouth 2 (two) times daily. 20 tablet 0  . hydrOXYzine (ATARAX/VISTARIL) 25 MG tablet Take 1 tablet (25 mg total) by mouth every 8 (eight) hours as needed for itching. 15 tablet 0  . predniSONE (DELTASONE) 10 MG tablet 3 tab po day 1, 2 tab po day 2, 1 tab po day 3 6 tablet 0   No current facility-administered medications on file prior to visit.    BP 118/82 mmHg  Pulse 79  Temp(Src) 98.2 F (36.8 C) (Oral)  Ht  (1.727 m)  Wt 262 lb 6.4 oz (119.024 kg)  BMI 39.91 kg/m2  SpO2 98%       Objective:   Physical Exam  General- No acute distress. Pleasant patient. Neck- Full range of motion, no jvd Lungs- Clear, even and unlabored. Heart- regular rate and rhythm. Neurologic- CNII- XII grossly intact.  Skin- left upper thigh 4.0 cm in diameter red area that is indurated and tender. But not fluctuant.(but more tender than last time.)  Rt latisimuss dorsi area- 3.5cm in diameter red area that is indurated and tender. But not fluctuant. Lt flank-3.5cm in diameter red area that is indurated and tender. But not fluctuant(slight more tender than last)       Assessment & Plan:  For your areas of infection. 2 out of 3 of the areas look little worse. But no definite abscess formed yet since center of areas are not soft. But overweekend the areas may worsen.  Continue doxycycline antibiotic antibiotic. I am adding bactrim ds antibiotic to your regimen.  Our staff tried to get you in  with surgeon. She may be able to  get you in for Tuesday.   If areas worsen over weekend since I and D may be indicate so in that event go to UC  Follow up on Monday or as needed.  Efren Kross, Ramon DredgeEdward, PA-C

## 2015-11-01 NOTE — Telephone Encounter (Signed)
Pt is returning call.    CB: [phone]

## 2015-11-01 NOTE — Patient Instructions (Addendum)
For your areas of infection. 2 out of 3 of the areas look little worse. But no definite abscess formed yet since center of areas are not soft. But overweekend the areas may worsen.  Continue doxycycline antibiotic antibiotic. I am adding bactrim ds antibiotic to your regimen.  Our staff tried to get you in  with surgeon. She may be able to get you in for Tuesday.   If areas worsen over weekend since I and D may be indicate so in that event go to UC  Follow up on Monday or as needed.

## 2015-12-18 ENCOUNTER — Ambulatory Visit: Payer: Self-pay

## 2015-12-18 ENCOUNTER — Ambulatory Visit (INDEPENDENT_AMBULATORY_CARE_PROVIDER_SITE_OTHER): Payer: Medicaid Other

## 2015-12-18 DIAGNOSIS — Z3042 Encounter for surveillance of injectable contraceptive: Secondary | ICD-10-CM

## 2015-12-18 MED ORDER — MEDROXYPROGESTERONE ACETATE 150 MG/ML IM SUSP
150.0000 mg | INTRAMUSCULAR | Status: DC
  Administered 2015-12-18 – 2016-10-28 (×3): 150 mg via INTRAMUSCULAR

## 2016-03-09 ENCOUNTER — Ambulatory Visit: Payer: Self-pay

## 2016-03-11 ENCOUNTER — Ambulatory Visit (INDEPENDENT_AMBULATORY_CARE_PROVIDER_SITE_OTHER): Payer: Medicaid Other

## 2016-03-11 DIAGNOSIS — Z3042 Encounter for surveillance of injectable contraceptive: Secondary | ICD-10-CM

## 2016-03-11 DIAGNOSIS — N898 Other specified noninflammatory disorders of vagina: Secondary | ICD-10-CM

## 2016-03-11 NOTE — Progress Notes (Signed)
Patient presented for Depo Provera injection. Patient instructed to return in three months for next injection- patient states understanding.  Patients states that she has had a vaginal discharge with some odor. Patient given Affirm swab and self swabbed. Sent to lab for culture. Armandina StammerJennifer Katlen Seyer RNBSN

## 2016-03-12 LAB — WET PREP BY MOLECULAR PROBE
CANDIDA SPECIES: NEGATIVE
GARDNERELLA VAGINALIS: NEGATIVE
TRICHOMONAS VAG: NEGATIVE

## 2016-07-29 ENCOUNTER — Ambulatory Visit (INDEPENDENT_AMBULATORY_CARE_PROVIDER_SITE_OTHER): Payer: Medicaid Other

## 2016-07-29 DIAGNOSIS — Z309 Encounter for contraceptive management, unspecified: Secondary | ICD-10-CM

## 2016-07-29 DIAGNOSIS — Z3042 Encounter for surveillance of injectable contraceptive: Secondary | ICD-10-CM

## 2016-07-29 MED ORDER — MEDROXYPROGESTERONE ACETATE 150 MG/ML IM SUSP: 150.0000 mg | INTRAMUSCULAR | 2 refills | Status: DC

## 2016-07-29 NOTE — Progress Notes (Signed)
Patient given injection from clinic. Patient will bring Rx for future appointments. Armandina StammerJennifer Pattye Meda RNBSN

## 2016-10-28 ENCOUNTER — Ambulatory Visit (INDEPENDENT_AMBULATORY_CARE_PROVIDER_SITE_OTHER): Payer: Medicaid Other

## 2016-10-28 DIAGNOSIS — Z308 Encounter for other contraceptive management: Secondary | ICD-10-CM | POA: Diagnosis not present

## 2016-10-28 DIAGNOSIS — Z3042 Encounter for surveillance of injectable contraceptive: Secondary | ICD-10-CM

## 2016-10-28 NOTE — Progress Notes (Signed)
Patient in office for Depo Provera injection. Armandina StammerJennifer Alexzia Kasler Rn BSN

## 2017-03-10 ENCOUNTER — Ambulatory Visit (INDEPENDENT_AMBULATORY_CARE_PROVIDER_SITE_OTHER): Payer: Self-pay | Admitting: *Deleted

## 2017-03-10 DIAGNOSIS — Z308 Encounter for other contraceptive management: Secondary | ICD-10-CM

## 2017-03-10 DIAGNOSIS — Z3202 Encounter for pregnancy test, result negative: Secondary | ICD-10-CM

## 2017-03-10 LAB — POCT URINE PREGNANCY: Preg Test, Ur: NEGATIVE

## 2017-03-10 NOTE — Progress Notes (Addendum)
Pt is in office for depo restart.  Pt was due for injection beginning of September.  Pt states she has had recent intercourse. Pt to have UPT today, instructed to abstain from intercourse for 2 weeks and return to office for UPT / depo injection.  UPT in office Negative today.  Pt states understanding of plan.  Attestation of Attending Supervision of RN: Evaluation and management procedures were performed by the nurse under my supervision and collaboration.  I have reviewed the nursing note and chart, and I agree with the management and plan.  Carolyn L. Harraway-Smith, M.D., Evern CoreFACOG

## 2017-03-24 ENCOUNTER — Other Ambulatory Visit: Payer: Self-pay

## 2019-12-20 ENCOUNTER — Emergency Department (HOSPITAL_BASED_OUTPATIENT_CLINIC_OR_DEPARTMENT_OTHER): Payer: Medicaid Other

## 2019-12-20 ENCOUNTER — Emergency Department (HOSPITAL_BASED_OUTPATIENT_CLINIC_OR_DEPARTMENT_OTHER)
Admission: EM | Admit: 2019-12-20 | Discharge: 2019-12-20 | Disposition: A | Discharge status: Home / Self Care | Payer: Medicaid Other | Attending: Emergency Medicine | Admitting: Emergency Medicine

## 2019-12-20 ENCOUNTER — Other Ambulatory Visit: Payer: Self-pay

## 2019-12-20 ENCOUNTER — Encounter (HOSPITAL_BASED_OUTPATIENT_CLINIC_OR_DEPARTMENT_OTHER): Payer: Self-pay

## 2019-12-20 DIAGNOSIS — Z3A00 Weeks of gestation of pregnancy not specified: Secondary | ICD-10-CM | POA: Diagnosis not present

## 2019-12-20 DIAGNOSIS — Z7952 Long term (current) use of systemic steroids: Secondary | ICD-10-CM | POA: Insufficient documentation

## 2019-12-20 DIAGNOSIS — O26891 Other specified pregnancy related conditions, first trimester: Secondary | ICD-10-CM | POA: Diagnosis not present

## 2019-12-20 DIAGNOSIS — Z3491 Encounter for supervision of normal pregnancy, unspecified, first trimester: Secondary | ICD-10-CM

## 2019-12-20 DIAGNOSIS — O99511 Diseases of the respiratory system complicating pregnancy, first trimester: Secondary | ICD-10-CM | POA: Diagnosis not present

## 2019-12-20 DIAGNOSIS — J45909 Unspecified asthma, uncomplicated: Secondary | ICD-10-CM | POA: Insufficient documentation

## 2019-12-20 DIAGNOSIS — O219 Vomiting of pregnancy, unspecified: Secondary | ICD-10-CM | POA: Insufficient documentation

## 2019-12-20 DIAGNOSIS — R112 Nausea with vomiting, unspecified: Secondary | ICD-10-CM

## 2019-12-20 DIAGNOSIS — R109 Unspecified abdominal pain: Secondary | ICD-10-CM | POA: Insufficient documentation

## 2019-12-20 LAB — URINALYSIS, ROUTINE W REFLEX MICROSCOPIC
Glucose, UA: NEGATIVE mg/dL
Hgb urine dipstick: NEGATIVE
Ketones, ur: >80 mg/dL — AB
Leukocytes,Ua: NEGATIVE
Nitrite: NEGATIVE
Protein, ur: 100 mg/dL — AB
Specific Gravity, Urine: >1.03 — ABNORMAL HIGH (ref 1.005–1.030)
pH: 6.5 (ref 5.0–8.0)

## 2019-12-20 LAB — CBC WITH DIFFERENTIAL/PLATELET
Abs Immature Granulocytes: 0.02 10*3/uL (ref 0.00–0.07)
Basophils Absolute: 0.1 10*3/uL (ref 0.0–0.1)
Basophils Relative: 1 %
Eosinophils Absolute: 0.1 10*3/uL (ref 0.0–0.5)
Eosinophils Relative: 1 %
HCT: 40.7 % (ref 36.0–46.0)
Hemoglobin: 13.4 g/dL (ref 12.0–15.0)
Immature Granulocytes: 0 %
Lymphocytes Relative: 22 %
Lymphs Abs: 1.9 10*3/uL (ref 0.7–4.0)
MCH: 26.7 pg (ref 26.0–34.0)
MCHC: 32.9 g/dL (ref 30.0–36.0)
MCV: 81.1 fL (ref 80.0–100.0)
Monocytes Absolute: 0.7 10*3/uL (ref 0.1–1.0)
Monocytes Relative: 8 %
Neutro Abs: 6 10*3/uL (ref 1.7–7.7)
Neutrophils Relative %: 68 %
Platelets: 213 10*3/uL (ref 150–400)
RBC: 5.02 MIL/uL (ref 3.87–5.11)
RDW: 13.9 % (ref 11.5–15.5)
WBC: 8.7 10*3/uL (ref 4.0–10.5)
nRBC: 0 % (ref 0.0–0.2)

## 2019-12-20 LAB — HCG, QUANTITATIVE, PREGNANCY: hCG, Beta Chain, Quant, S: 146496 m[IU]/mL — ABNORMAL HIGH (ref <5)

## 2019-12-20 LAB — BASIC METABOLIC PANEL
Anion gap: 13 (ref 5–15)
BUN: 13 mg/dL (ref 6–20)
CO2: 22 mmol/L (ref 22–32)
Calcium: 9.1 mg/dL (ref 8.9–10.3)
Chloride: 100 mmol/L (ref 98–111)
Creatinine, Ser: 0.72 mg/dL (ref 0.44–1.00)
GFR calc Af Amer: >60 mL/min (ref >60)
GFR calc non Af Amer: >60 mL/min (ref >60)
Glucose, Bld: 88 mg/dL (ref 70–99)
Potassium: 3.3 mmol/L — ABNORMAL LOW (ref 3.5–5.1)
Sodium: 135 mmol/L (ref 135–145)

## 2019-12-20 LAB — URINALYSIS, MICROSCOPIC (REFLEX)

## 2019-12-20 LAB — HEPATIC FUNCTION PANEL
ALT: 18 U/L (ref 0–44)
AST: 18 U/L (ref 15–41)
Albumin: 4.6 g/dL (ref 3.5–5.0)
Alkaline Phosphatase: 64 U/L (ref 38–126)
Bilirubin, Direct: 0.2 mg/dL (ref 0.0–0.2)
Indirect Bilirubin: 0.8 mg/dL (ref 0.3–0.9)
Total Bilirubin: 1 mg/dL (ref 0.3–1.2)
Total Protein: 7.8 g/dL (ref 6.5–8.1)

## 2019-12-20 MED ORDER — DOXYLAMINE-PYRIDOXINE 10-10 MG PO TBEC: 1.0000 | DELAYED_RELEASE_TABLET | Freq: Every evening | ORAL | 0 refills | Status: DC

## 2019-12-20 MED ORDER — ONDANSETRON HCL 4 MG/2ML IJ SOLN
4.0000 mg | Freq: Once | INTRAMUSCULAR | Status: AC
  Administered 2019-12-20: 4 mg via INTRAVENOUS
  Filled 2019-12-20: qty 2

## 2019-12-20 MED ORDER — ONDANSETRON HCL 4 MG PO TABS: 4.0000 mg | ORAL_TABLET | Freq: Three times a day (TID) | ORAL | 0 refills | Status: DC | PRN

## 2019-12-20 MED ORDER — SODIUM CHLORIDE 0.9 % IV BOLUS
1000.0000 mL | Freq: Once | INTRAVENOUS | Status: AC
Start: 2019-12-20 — End: 2019-12-20
  Administered 2019-12-20: 1000 mL via INTRAVENOUS

## 2019-12-20 MED FILL — ONDANSETRON HCL 4 MG TABLET: 4 | 6 days supply | Qty: 20 | Fill #0

## 2019-12-20 MED FILL — DICLEGIS DR 10-10 MG TABLET: 10-10 | 30 days supply | Qty: 30 | Fill #0

## 2019-12-20 NOTE — Discharge Instructions (Signed)
Please use doxylamine as her first-line nausea medicine, if not much improvement with this take Zofran.  Follow-up with your [facility] doctor as scheduled.  Please return or call your OB if you develop any severe abdominal pain, vaginal bleeding.  Continue taking prenatal vitamins

## 2019-12-20 NOTE — ED Provider Notes (Signed)
[facility] EMERGENCY DEPARTMENT Provider Note   CSN: [id number] Arrival date & time: 12/20/19  1034     History Chief Complaint  Patient presents with  . Emesis    Karen Soto is a 29 y.o. female.  The history is provided by the patient.  Emesis Severity:  Moderate Duration:  5 days Timing:  Intermittent Quality:  Stomach contents Progression:  Unchanged Chronicity:  New Worsened by:  Nothing Associated symptoms: abdominal pain   Associated symptoms: no arthralgias, no chills, no cough, no diarrhea, no fever and no sore throat   Risk factors: pregnant (last menstrual cycle in early may, has not seen OB yet)        Past Medical History:  Diagnosis Date  . Asthma   . Chlamydia   . Drooping eyelid 01/04/2012   right  . Headache(784.0) 01/08/2012   "lots since Monday (01/04/2012)"  . Obesity     Patient Active Problem List   Diagnosis Date Noted  . SVD (spontaneous vaginal delivery) 08/10/2015  . Active labor 08/09/2015  . Rh negative state in antepartum period 08/09/2015  . Chlamydia infection affecting pregnancy in third trimester, antepartum 07/18/2015  . Abnormal maternal glucose tolerance, antepartum 05/17/2015  . Obesity in pregnancy, antepartum 05/15/2015  . Supervision of normal first pregnancy 02/11/2015    Past Surgical History:  Procedure Laterality Date  . APPENDECTOMY  01/08/2012  . APPENDECTOMY    . INCISION AND DRAINAGE OF WOUND  ~ 2010   "MRSA in her back; had it drained @ the hospital"  . LAPAROSCOPIC APPENDECTOMY  01/08/2012   Procedure: APPENDECTOMY LAPAROSCOPIC;  Surgeon: Robyne Askew, MD;  Location: [facility] OR;  Service: General;  Laterality: N/A;     OB History    Gravida  2   Para  1   Term  1   Preterm  0   AB  0   Living  1     SAB  0   TAB  0   Ectopic  0   Multiple  0   Live Births  1           Family History  Problem Relation Age of Onset  . Hypertension Mother   . Stroke Mother   . Cancer  Father        skin  . Prostate cancer Father   . Diabetes Paternal Grandmother     Social History   Tobacco Use  . Smoking status: Never Smoker  . Smokeless tobacco: Never Used  Substance Use Topics  . Alcohol use: Not Currently    Comment: occ  . Drug use: No    Home Medications Prior to Admission medications   Medication Sig Start Date End Date Taking? Authorizing Provider  doxycycline (VIBRA-TABS) 100 MG tablet Take 1 tablet (100 mg total) by mouth 2 (two) times daily. 10/29/15   Saguier, Ramon Dredge, PA-C  Doxylamine-Pyridoxine 10-10 MG TBEC Take 1 tablet by mouth at bedtime for 30 doses. 12/20/19 01/19/20  Hughey Rittenberry, DO  hydrOXYzine (ATARAX/VISTARIL) 25 MG tablet Take 1 tablet (25 mg total) by mouth every 8 (eight) hours as needed for itching. 10/29/15   Saguier, Ramon Dredge, PA-C  medroxyPROGESTERone (DEPO-PROVERA) 150 MG/ML injection Inject 1 mL (150 mg total) into the muscle every 3 (three) months. 07/29/16   Allie Bossier, MD  ondansetron (ZOFRAN) 4 MG tablet Take 1 tablet (4 mg total) by mouth every 8 (eight) hours as needed for up to 20 doses for nausea or  vomiting. 12/20/19   Dorise Gangi, DO  predniSONE (DELTASONE) 10 MG tablet 3 tab po day 1, 2 tab po day 2, 1 tab po day 3 10/29/15   Saguier, Ramon Dredge, PA-C  sulfamethoxazole-trimethoprim (BACTRIM DS,SEPTRA DS) 800-160 MG tablet Take 1 tablet by mouth 2 (two) times daily. 11/01/15   Saguier, Ramon Dredge, PA-C    Allergies    Patient has no known allergies.  Review of Systems   Review of Systems  Constitutional: Negative for chills and fever.  HENT: Negative for ear pain and sore throat.   Eyes: Negative for pain and visual disturbance.  Respiratory: Negative for cough and shortness of breath.   Cardiovascular: Negative for chest pain and palpitations.  Gastrointestinal: Positive for abdominal pain and vomiting. Negative for diarrhea.  Genitourinary: Negative for dysuria and hematuria.  Musculoskeletal: Negative for arthralgias and back  pain.  Skin: Negative for color change and rash.  Neurological: Negative for seizures and syncope.  All other systems reviewed and are negative.   Physical Exam Updated Vital Signs  ED Triage Vitals [12/20/19 1045]  Enc Vitals Group     BP 124/82     Pulse Rate 70     Resp 20     Temp 98.8 F (37.1 C)     Temp Source Oral     SpO2 100 %     Weight 199 lb (90.3 kg)     Height 5\' 9"  (1.753 m)     Head Circumference      Peak Flow      Pain Score 0     Pain Loc      Pain Edu?      Excl. in GC?     Physical Exam Vitals and nursing note reviewed.  Constitutional:      General: She is not in acute distress.    Appearance: She is well-developed.  HENT:     Head: Normocephalic and atraumatic.     Nose: Nose normal.     Mouth/Throat:     Mouth: Mucous membranes are moist.  Eyes:     Extraocular Movements: Extraocular movements intact.     Conjunctiva/sclera: Conjunctivae normal.     Pupils: Pupils are equal, round, and reactive to light.  Cardiovascular:     Rate and Rhythm: Normal rate and regular rhythm.     Pulses: Normal pulses.     Heart sounds: Normal heart sounds. No murmur heard.   Pulmonary:     Effort: Pulmonary effort is normal. No respiratory distress.     Breath sounds: Normal breath sounds.  Abdominal:     General: There is no distension.     Palpations: Abdomen is soft.     Tenderness: There is no abdominal tenderness.  Musculoskeletal:     Cervical back: Normal range of motion and neck supple.  Skin:    General: Skin is warm and dry.  Neurological:     Mental Status: She is alert.    EMERGENCY DEPARTMENT PREGNANCY "Study: Limited Ultrasound of the Pelvis for Pregnancy"  INDICATIONS:Pregnancy(required) Multiple views of the uterus and pelvic cavity were obtained in real-time with a multi-frequency probe.  APPROACH:Transabdominal  PERFORMED BY: Myself IMAGES ARCHIVED?: Yes LIMITATIONS: none Overall believe I do see if yolk sac with fetal  heart rate but unable to obtain optimal photos.  ED Results / Procedures / Treatments   Labs (all labs ordered are listed, but only abnormal results are displayed) Labs Reviewed  BASIC METABOLIC PANEL - Abnormal; Notable  for the following components:      Result Value   Potassium 3.3 (*)    All other components within normal limits  URINALYSIS, ROUTINE W REFLEX MICROSCOPIC - Abnormal; Notable for the following components:   Specific Gravity, Urine >1.030 (*)    Bilirubin Urine SMALL (*)    Ketones, ur >80 (*)    Protein, ur 100 (*)    All other components within normal limits  HCG, QUANTITATIVE, PREGNANCY - Abnormal; Notable for the following components:   hCG, Beta Chain, Quant, S 146,496 (*)    All other components within normal limits  URINALYSIS, MICROSCOPIC (REFLEX) - Abnormal; Notable for the following components:   Bacteria, UA FEW (*)    All other components within normal limits  CBC WITH DIFFERENTIAL/PLATELET  HEPATIC FUNCTION PANEL    EKG None  Radiology US OB Comp < 14 Wks  Result Date: 12/20/2019 CLINICAL DATA:  Abdominal pain in first trimester pregnancy, unknown LMP, quantitative beta HCG 146,496 EXAM: OBSTETRIC <14 WK ULTRASOUND TECHNIQUE: Transabdominal ultrasound was performed for evaluation of the gestation as well as the maternal uterus and adnexal regions. COMPARISON:  None for this gestation FINDINGS: Intrauterine gestational sac: Present, single Yolk sac:  Present Embryo:  Present Cardiac Activity: Present Heart Rate: 158 bpm CRL:   12.2 mm   7 w 3 d                  US EDC: 08/04/2020 Subchorionic hemorrhage:  None visualized. Maternal uterus/adnexae: RIGHT ovary normal size and morphology, 2.0 x 2.4 x 1.1 cm. LEFT ovary normal size and morphology, 2.9 x 2.4 x 2.4 cm. No free pelvic fluid or adnexal masses. IMPRESSION: Single live intrauterine gestation at 7 weeks 3 days EGA by crown-rump length. No acute abnormalities. Electronically Signed   By: Ulyses SouthwardMark  Boles  M.D.   On: 12/20/2019 12:52    Procedures Procedures (including critical care time)  Medications Ordered in ED Medications  ondansetron (ZOFRAN) injection 4 mg (4 mg Intravenous Given 12/20/19 1117)  sodium chloride 0.9 % bolus 1,000 mL (0 mLs Intravenous Stopped 12/20/19 1230)    ED Course  I have reviewed the triage vital signs and the nursing notes.  Pertinent labs & imaging results that were available during my care of the patient were reviewed by me and considered in my medical decision making (see chart for details).    MDM Rules/Calculators/A&P                          Karen Soto is a 29 year old female with no significant medical history presents the ED with nausea and vomiting.  Patient with normal vitals.  No fever.  States that she had a recent positive pregnancy test about 2 weeks ago with the health department.  Has not been able to see her OB doctor yet.  Has not had any vaginal bleeding or discharge.  Has had some abdominal cramping.  No specific abdominal tenderness on exam.  Patient overall appears uncomfortable.  States that she is been throwing up for the last 5 days.  Overall my ultrasound I believe I see an IUP but will get formal ultrasound given patient's discomfort.  Suspect hyperemesis from pregnancy.  We will try to rule out ectopic pregnancy and check basic labs and electrolytes.  Lab work overall unremarkable.  Ultrasound shows intrauterine pregnancy.  Patient felt better after IV fluids, Zofran.  Prescribed antiemetics.  Already has appointment with OB scheduled.  Understands return precautions.  This chart was dictated using voice recognition software.  Despite best efforts to proofread,  errors can occur which can change the documentation meaning.    Final Clinical Impression(s) / ED Diagnoses Final diagnoses:  Nausea and vomiting, intractability of vomiting not specified, unspecified vomiting type  Normal intrauterine pregnancy on prenatal ultrasound  in first trimester    Rx / DC Orders ED Discharge Orders         Ordered    ondansetron (ZOFRAN) 4 MG tablet  Every 8 hours PRN     Discontinue  Reprint     12/20/19 1159    Doxylamine-Pyridoxine 10-10 MG TBEC  Nightly     Discontinue  Reprint     12/20/19 1159           Michaelyn Wall, DO 12/20/19 1310

## 2019-12-20 NOTE — ED Triage Notes (Signed)
Pt arrives to ED with c/o vomiting X5 days. States that she is pregnant, has had a positive at the health department around 2 weeks ago, doe snot have OB GYN, unsure EDD, thinks her last cycle may have been around May 6th.

## 2020-01-04 ENCOUNTER — Inpatient Hospital Stay (HOSPITAL_COMMUNITY)
Admission: AD | Admit: 2020-01-04 | Discharge: 2020-01-04 | Disposition: A | Discharge status: Home / Self Care | Payer: Medicaid Other | Attending: Obstetrics and Gynecology | Admitting: Obstetrics and Gynecology

## 2020-01-04 ENCOUNTER — Encounter (HOSPITAL_COMMUNITY): Payer: Self-pay | Admitting: Obstetrics and Gynecology

## 2020-01-04 ENCOUNTER — Other Ambulatory Visit: Payer: Self-pay

## 2020-01-04 DIAGNOSIS — Z79899 Other long term (current) drug therapy: Secondary | ICD-10-CM | POA: Insufficient documentation

## 2020-01-04 DIAGNOSIS — Z3A09 9 weeks gestation of pregnancy: Secondary | ICD-10-CM | POA: Diagnosis not present

## 2020-01-04 DIAGNOSIS — O99211 Obesity complicating pregnancy, first trimester: Secondary | ICD-10-CM | POA: Diagnosis not present

## 2020-01-04 DIAGNOSIS — J45909 Unspecified asthma, uncomplicated: Secondary | ICD-10-CM | POA: Diagnosis not present

## 2020-01-04 DIAGNOSIS — O219 Vomiting of pregnancy, unspecified: Secondary | ICD-10-CM | POA: Diagnosis present

## 2020-01-04 DIAGNOSIS — O99511 Diseases of the respiratory system complicating pregnancy, first trimester: Secondary | ICD-10-CM | POA: Diagnosis not present

## 2020-01-04 LAB — URINALYSIS, ROUTINE W REFLEX MICROSCOPIC
Bilirubin Urine: NEGATIVE
Glucose, UA: NEGATIVE mg/dL
Hgb urine dipstick: NEGATIVE
Ketones, ur: 80 mg/dL — AB
Leukocytes,Ua: NEGATIVE
Nitrite: NEGATIVE
Protein, ur: 100 mg/dL — AB
Specific Gravity, Urine: 1.024 (ref 1.005–1.030)
pH: 7 (ref 5.0–8.0)

## 2020-01-04 MED ORDER — FAMOTIDINE IN NACL 20-0.9 MG/50ML-% IV SOLN
20.0000 mg | Freq: Once | INTRAVENOUS | Status: AC
  Administered 2020-01-04: 20 mg via INTRAVENOUS
  Filled 2020-01-04: qty 50

## 2020-01-04 MED ORDER — METOCLOPRAMIDE HCL 10 MG PO TABS: 10.0000 mg | ORAL_TABLET | Freq: Three times a day (TID) | ORAL | 0 refills | Status: DC | PRN

## 2020-01-04 MED ORDER — DOXYLAMINE-PYRIDOXINE 10-10 MG PO TBEC: DELAYED_RELEASE_TABLET | ORAL | 0 refills | Status: DC

## 2020-01-04 MED ORDER — METOCLOPRAMIDE HCL 5 MG/ML IJ SOLN
10.0000 mg | Freq: Once | INTRAMUSCULAR | Status: AC
  Administered 2020-01-04: 10 mg via INTRAVENOUS
  Filled 2020-01-04: qty 2

## 2020-01-04 MED ORDER — LACTATED RINGERS IV BOLUS
1000.0000 mL | Freq: Once | INTRAVENOUS | Status: AC
  Administered 2020-01-04: 1000 mL via INTRAVENOUS

## 2020-01-04 NOTE — Discharge Instructions (Signed)
Safe Medications in Pregnancy   Acne: Benzoyl Peroxide Salicylic Acid  Backache/Headache: Tylenol: 2 regular strength every 4 hours OR              2 Extra strength every 6 hours  Colds/Coughs/Allergies: Benadryl (alcohol free) 25 mg every 6 hours as needed Breath right strips Claritin Cepacol throat lozenges Chloraseptic throat spray Cold-Eeze- up to three times per day Cough drops, alcohol free Flonase (by prescription only) Guaifenesin Mucinex Robitussin DM (plain only, alcohol free) Saline nasal spray/drops Sudafed (pseudoephedrine) & Actifed ** use only after [redacted] weeks gestation and if you do not have high blood pressure Tylenol Vicks Vaporub Zinc lozenges Zyrtec   Constipation: Colace Ducolax suppositories Fleet enema Glycerin suppositories Metamucil Milk of magnesia Miralax Senokot Smooth move tea  Diarrhea: Kaopectate Imodium A-D  *NO pepto Bismol  Hemorrhoids: Anusol Anusol HC Preparation H Tucks  Indigestion: Tums Maalox Mylanta Zantac  Pepcid  Insomnia: Benadryl (alcohol free) 25mg  every 6 hours as needed Tylenol PM Unisom, no Gelcaps  Leg Cramps: Tums MagGel  Nausea/Vomiting:  Bonine Dramamine Emetrol Ginger extract Sea bands Meclizine  Nausea medication to take during pregnancy:  Unisom (doxylamine succinate 25 mg tablets) Take one tablet daily at bedtime. If symptoms are not adequately controlled, the dose can be increased to a maximum recommended dose of two tablets daily (1/2 tablet in the morning, 1/2 tablet mid-afternoon and one at bedtime). Vitamin B6 100mg  tablets. Take one tablet twice a day (up to 200 mg per day).  Skin Rashes: Aveeno products Benadryl cream or 25mg  every 6 hours as needed Calamine Lotion 1% cortisone cream  Yeast infection: Gyne-lotrimin 7 Monistat 7  Gum/tooth pain: Anbesol  **If taking multiple medications, please check labels to avoid duplicating the same active ingredients **take  medication as directed on the label ** Do not exceed 4000 mg of tylenol in 24 hours **Do not take medications that contain aspirin or ibuprofen     Hyperemesis Gravidarum Hyperemesis gravidarum is a severe form of nausea and vomiting that happens during pregnancy. Hyperemesis is worse than morning sickness. It may cause you to have nausea or vomiting all day for many days. It may keep you from eating and drinking enough food and liquids, which can lead to dehydration, malnutrition, and weight loss. Hyperemesis usually occurs during the first half (the first 20 weeks) of pregnancy. It often goes away once a woman is in her second half of pregnancy. However, sometimes hyperemesis continues through an entire pregnancy. What are the causes? The cause of this condition is not known. It may be related to changes in chemicals (hormones) in the body during pregnancy, such as the high level of pregnancy hormone (human chorionic gonadotropin) or the increase in the female sex hormone (estrogen). What are the signs or symptoms? Symptoms of this condition include:  Nausea that does not go away.  Vomiting that does not allow you to keep any food down.  Weight loss.  Body fluid loss (dehydration).  Having no desire to eat, or not liking food that you have previously enjoyed. How is this diagnosed? This condition may be diagnosed based on:  A physical exam.  Your medical history.  Your symptoms.  Blood tests.  Urine tests. How is this treated? This condition is managed by controlling symptoms. This may include:  Following an eating plan. This can help lessen nausea and vomiting.  Taking prescription medicines. An eating plan and medicines are often used together to help control symptoms. If medicines do  not help relieve nausea and vomiting, you may need to receive fluids through an IV at the hospital. Follow these instructions at home: Eating and drinking   Avoid the  following: ? Drinking fluids with meals. Try not to drink anything during the 30 minutes before and after your meals. ? Drinking more than 1 cup of fluid at a time. ? Eating foods that trigger your symptoms. These may include spicy foods, coffee, high-fat foods, very sweet foods, and acidic foods. ? Skipping meals. Nausea can be more intense on an empty stomach. If you cannot tolerate food, do not force it. Try sucking on ice chips or other frozen items and make up for missed calories later. ? Lying down within 2 hours after eating. ? Being exposed to environmental triggers. These may include food smells, smoky rooms, closed spaces, rooms with strong smells, warm or humid places, overly loud and noisy rooms, and rooms with motion or flickering lights. Try eating meals in a well-ventilated area that is free of strong smells. ? Quick and sudden changes in your movement. ? Taking iron pills and multivitamins that contain iron. If you take prescription iron pills, do not stop taking them unless your health care provider approves. ? Preparing food. The smell of food can spoil your appetite or trigger nausea.  To help relieve your symptoms: ? Listen to your body. Everyone is different and has different preferences. Find what works best for you. ? Eat and drink slowly. ? Eat 5-6 small meals daily instead of 3 large meals. Eating small meals and snacks can help you avoid an empty stomach. ? In the morning, before getting out of bed, eat a couple of crackers to avoid moving around on an empty stomach. ? Try eating starchy foods as these are usually tolerated well. Examples include cereal, toast, bread, potatoes, pasta, rice, and pretzels. ? Include at least 1 serving of protein with your meals and snacks. Protein options include lean meats, poultry, seafood, beans, nuts, nut butters, eggs, cheese, and yogurt. ? Try eating a protein-rich snack before bed. Examples of a protein-rick snack include cheese and  crackers or a peanut butter sandwich made with 1 slice of whole-wheat bread and 1 tsp (5 g) of peanut butter. ? Eat or suck on things that have ginger in them. It may help relieve nausea. Add  tsp ground ginger to hot tea or choose ginger tea. ? Try drinking 100% fruit juice or an electrolyte drink. An electrolyte drink contains sodium, potassium, and chloride. ? Drink fluids that are cold, clear, and carbonated or sour. Examples include lemonade, ginger ale, lemon-lime soda, ice water, and sparkling water. ? Brush your teeth or use a mouth rinse after meals. ? Talk with your health care provider about starting a supplement of vitamin B6. General instructions  Take over-the-counter and prescription medicines only as told by your health care provider.  Follow instructions from your health care provider about eating or drinking restrictions.  Continue to take your prenatal vitamins as told by your health care provider. If you are having trouble taking your prenatal vitamins, talk with your health care provider about different options.  Keep all follow-up and pre-birth (prenatal) visits as told by your health care provider. This is important. Contact a health care provider if:  You have pain in your abdomen.  You have a severe headache.  You have vision problems.  You are losing weight.  You feel weak or dizzy. Get help right away if:  You  cannot drink fluids without vomiting.  You vomit blood.  You have constant nausea and vomiting.  You are very weak.  You faint.  You have a fever and your symptoms suddenly get worse. Summary  Hyperemesis gravidarum is a severe form of nausea and vomiting that happens during pregnancy.  Making some changes to your eating habits may help relieve nausea and vomiting.  This condition may be managed with medicine.  If medicines do not help relieve nausea and vomiting, you may need to receive fluids through an IV at the hospital. This  information is not intended to replace advice given to you by your health care provider. Make sure you discuss any questions you have with your health care provider. Document Revised: 05/31/2017 Document Reviewed: 01/08/2016 Elsevier Patient Education  2020 [employer].

## 2020-01-04 NOTE — MAU Note (Signed)
Went to ER 2 wks ago, was told she had hyperemesis, meds are not working. Only peeing like twice a day. Throwing up like 20 times a day and has lost 8#.  Having loose stools.

## 2020-01-04 NOTE — MAU Provider Note (Signed)
Chief Complaint: Emesis   First Provider Initiated Contact with Patient 01/04/20 1456     SUBJECTIVE HPI: Karen Soto is a 29 y.o. G2P1001 at [redacted]w[redacted]d who presents to Maternity Admissions reporting nausea & vomiting. Symptoms started 2 weeks ago. Was seen in the ED & prescribed diclegis & zofran. States she was only told to take the diclegis as needed, then take the zofran if the diclegis didn't work. Last took meds on Tuesday; states stopped taking them because they were working. Reports that she vomits about 20 times per day. Symptoms made worse with food & drink. Sometimes is able to keep down bread but more recently can't keep that down for more than 30 minutes. For the last few days has been having loose stools that she attributes to not eating solid food and dehydration. Denies fever/chills, abdominal pain or vaginal bleeding.     Past Medical History:  Diagnosis Date  . Asthma   . Chlamydia   . Drooping eyelid 01/04/2012   right  . Headache(784.0) 01/08/2012   "lots since Monday (01/04/2012)"  . Obesity    OB History  Gravida Para Term Preterm AB Living  2 1 1  0 0 1  SAB TAB Ectopic Multiple Live Births  0 0 0 0 1    # Outcome Date GA Lbr Len/2nd Weight Sex Delivery Anes PTL Lv  2 Current           1 Term 08/10/15 [redacted]w[redacted]d 06:02 / 03:01 3070 g M Vag-Spont EPI  LIV   Past Surgical History:  Procedure Laterality Date  . APPENDECTOMY  01/08/2012  . APPENDECTOMY    . INCISION AND DRAINAGE OF WOUND  ~ 2010   "MRSA in her back; had it drained @ the hospital"  . LAPAROSCOPIC APPENDECTOMY  01/08/2012   Procedure: APPENDECTOMY LAPAROSCOPIC;  Surgeon: 01/10/2012, MD;  Location: [facility] OR;  Service: General;  Laterality: N/A;   Social History   Socioeconomic History  . Marital status: Single    Spouse name: Not on file  . Number of children: Not on file  . Years of education: Not on file  . Highest education level: Not on file  Occupational History  . Not on file  Tobacco Use  .  Smoking status: Never Smoker  . Smokeless tobacco: Never Used  Substance and Sexual Activity  . Alcohol use: Not Currently    Comment: occ  . Drug use: No  . Sexual activity: Yes    Birth control/protection: Injection  Other Topics Concern  . Not on file  Social History Narrative   ** Merged History Encounter **       Social Determinants of Health   Financial Resource Strain:   . Difficulty of Paying Living Expenses:   Food Insecurity:   . Worried About [facility] in the Last Year:   . Programme researcher, broadcasting/film/video in the Last Year:   Transportation Needs:   . Barista (Medical):   Freight forwarder Lack of Transportation (Non-Medical):   Physical Activity:   . Days of Exercise per Week:   . Minutes of Exercise per Session:   Stress:   . Feeling of Stress :   Social Connections:   . Frequency of Communication with Friends and Family:   . Frequency of Social Gatherings with Friends and Family:   . Attends Religious Services:   . Active Member of Clubs or Organizations:   . Attends [employer] Meetings:   .  Marital Status:   Intimate Partner Violence:   . Fear of Current or Ex-Partner:   . Emotionally Abused:   [place] [employer] Physically Abused:   . Sexually Abused:    Family History  Problem Relation Age of Onset  . Hypertension Mother   . Stroke Mother   . Cancer Father        skin  . Prostate cancer Father   . Diabetes Paternal Grandmother    No current facility-administered medications on file prior to encounter.   Current Outpatient Medications on File Prior to Encounter  Medication Sig Dispense Refill  . ondansetron (ZOFRAN) 4 MG tablet Take 1 tablet (4 mg total) by mouth every 8 (eight) hours as needed for up to 20 doses for nausea or vomiting. 20 tablet 0  . predniSONE (DELTASONE) 10 MG tablet 3 tab po day 1, 2 tab po day 2, 1 tab po day 3 6 tablet 0  . Prenatal Vit-Fe Fumarate-FA (PRENATAL MULTIVITAMIN) TABS tablet Take 1 tablet by mouth daily at 12 noon.    [place]  doxycycline (VIBRA-TABS) 100 MG tablet Take 1 tablet (100 mg total) by mouth 2 (two) times daily. 20 tablet 0  . hydrOXYzine (ATARAX/VISTARIL) 25 MG tablet Take 1 tablet (25 mg total) by mouth every 8 (eight) hours as needed for itching. 15 tablet 0  . medroxyPROGESTERone (DEPO-PROVERA) 150 MG/ML injection Inject 1 mL (150 mg total) into the muscle every 3 (three) months. 1 mL 2  . sulfamethoxazole-trimethoprim (BACTRIM DS,SEPTRA DS) 800-160 MG tablet Take 1 tablet by mouth 2 (two) times daily. 14 tablet 0   No Known Allergies  I have reviewed patient's Past Medical Hx, Surgical Hx, Family Hx, Social Hx, medications and allergies.   Review of Systems  Constitutional: Negative.   Gastrointestinal: Positive for nausea and vomiting. Negative for abdominal pain, constipation and diarrhea.  Genitourinary: Negative.     OBJECTIVE Patient Vitals for the past 24 hrs:  BP Temp Temp src Pulse Resp SpO2 Height Weight  01/04/20 1305 130/84 98.6 F (37 C) Oral 81 18 100 % 5\' 9"  (1.753 m) 88 kg   Constitutional: Well-developed, well-nourished female in no acute distress.  Cardiovascular: normal rate & rhythm, no murmur Respiratory: normal rate and effort. Lung sounds clear throughout GI: Abd soft, non-tender, Pos BS x 4. No guarding or rebound tenderness MS: Extremities nontender, no edema, normal ROM Neurologic: Alert and oriented x 4.   LAB RESULTS Results for orders placed or performed during the hospital encounter of 01/04/20 (from the past 24 hour(s))  Urinalysis, Routine w reflex microscopic Urine, Clean Catch     Status: Abnormal   Collection Time: 01/04/20  2:12 PM  Result Value Ref Range   Color, Urine YELLOW YELLOW   APPearance HAZY (A) CLEAR   Specific Gravity, Urine 1.024 1.005 - 1.030   pH 7.0 5.0 - 8.0   Glucose, UA NEGATIVE NEGATIVE mg/dL   Hgb urine dipstick NEGATIVE NEGATIVE   Bilirubin Urine NEGATIVE NEGATIVE   Ketones, ur 80 (A) NEGATIVE mg/dL   Protein, ur 03/05/20 (A)  NEGATIVE mg/dL   Nitrite NEGATIVE NEGATIVE   Leukocytes,Ua NEGATIVE NEGATIVE   RBC / HPF 0-5 0 - 5 RBC/hpf   WBC, UA 0-5 0 - 5 WBC/hpf   Bacteria, UA RARE (A) NONE SEEN   Squamous Epithelial / LPF 0-5 0 - 5   Mucus PRESENT     IMAGING No results found.  MAU COURSE Orders Placed This Encounter  Procedures  . Urinalysis, Routine  w reflex microscopic Urine, Clean Catch  . Discharge patient   Meds ordered this encounter  Medications  . lactated ringers bolus 1,000 mL  . famotidine (PEPCID) IVPB 20 mg premix  . metoCLOPramide (REGLAN) injection 10 mg  . Doxylamine-Pyridoxine 10-10 MG TBEC    Sig: Take 2 tabs at bedtime. If needed, add another tab in the morning. If needed, add another tab in the afternoon, up to 4 tabs/day.    Dispense:  90 tablet    Refill:  0    Order Specific Question:   Supervising Provider    Answer:   Mariel Aloe A [[id number]]  . metoCLOPramide (REGLAN) 10 MG tablet    Sig: Take 1 tablet (10 mg total) by mouth every 8 (eight) hours as needed for nausea.    Dispense:  30 tablet    Refill:  0    Order Specific Question:   Supervising Provider    Answer:   Warden Fillers [id number]    MDM Patient with 80+ ketones on urinalysis.  Offered IV fluids. Given 1 liter of LR bolus, 10 mg of reglan, and 20 mg of pepcid. Patient tolerated PO challenge. Will discharge home with reglan prescription and discussed appropriate dosing of diclegis.   ASSESSMENT 1. Nausea and vomiting during pregnancy prior to [redacted] weeks gestation   2. [redacted] weeks gestation of pregnancy     PLAN Discharge home in stable condition. Rx reglan Given info about diclegis dosing Discussed reasons to return to MAU   Allergies as of 01/04/2020   No Known Allergies     Medication List    STOP taking these medications   doxycycline 100 MG tablet Commonly known as: VIBRA-TABS   hydrOXYzine 25 MG tablet Commonly known as: ATARAX/VISTARIL   medroxyPROGESTERone 150 MG/ML  injection Commonly known as: DEPO-PROVERA   ondansetron 4 MG tablet Commonly known as: Zofran   predniSONE 10 MG tablet Commonly known as: DELTASONE   sulfamethoxazole-trimethoprim 800-160 MG tablet Commonly known as: BACTRIM DS     TAKE these medications   Doxylamine-Pyridoxine 10-10 MG Tbec Take 2 tabs at bedtime. If needed, add another tab in the morning. If needed, add another tab in the afternoon, up to 4 tabs/day. What changed:   how much to take  how to take this  when to take this  additional instructions   metoCLOPramide 10 MG tablet Commonly known as: REGLAN Take 1 tablet (10 mg total) by mouth every 8 (eight) hours as needed for nausea.   prenatal multivitamin Tabs tablet Take 1 tablet by mouth daily at 12 noon.        Judeth Horn, NP 01/04/2020  5:35 PM

## 2020-01-08 ENCOUNTER — Other Ambulatory Visit: Payer: Self-pay

## 2020-01-08 ENCOUNTER — Other Ambulatory Visit (HOSPITAL_COMMUNITY)
Admission: RE | Admit: 2020-01-08 | Discharge: 2020-01-08 | Disposition: A | Discharge status: Home / Self Care | Payer: Medicaid Other | Source: Ambulatory Visit | Attending: Family Medicine | Admitting: Family Medicine

## 2020-01-08 ENCOUNTER — Encounter: Payer: Self-pay | Admitting: Family Medicine

## 2020-01-08 ENCOUNTER — Ambulatory Visit (INDEPENDENT_AMBULATORY_CARE_PROVIDER_SITE_OTHER): Payer: Medicaid Other | Admitting: Family Medicine

## 2020-01-08 VITALS — BP 113/86 | HR 91 | Wt 191.0 lb

## 2020-01-08 DIAGNOSIS — D563 Thalassemia minor: Secondary | ICD-10-CM

## 2020-01-08 DIAGNOSIS — Z3A10 10 weeks gestation of pregnancy: Secondary | ICD-10-CM

## 2020-01-08 DIAGNOSIS — Z3481 Encounter for supervision of other normal pregnancy, first trimester: Secondary | ICD-10-CM | POA: Diagnosis not present

## 2020-01-08 DIAGNOSIS — Z8632 Personal history of gestational diabetes: Secondary | ICD-10-CM

## 2020-01-08 DIAGNOSIS — Z3480 Encounter for supervision of other normal pregnancy, unspecified trimester: Secondary | ICD-10-CM

## 2020-01-08 DIAGNOSIS — O09299 Supervision of pregnancy with other poor reproductive or obstetric history, unspecified trimester: Secondary | ICD-10-CM

## 2020-01-08 DIAGNOSIS — O0993 Supervision of high risk pregnancy, unspecified, third trimester: Secondary | ICD-10-CM | POA: Insufficient documentation

## 2020-01-08 LAB — OB RESULTS CONSOLE RUBELLA ANTIBODY, IGM: Rubella: IMMUNE

## 2020-01-08 LAB — OB RESULTS CONSOLE HIV ANTIBODY (ROUTINE TESTING): HIV: NONREACTIVE

## 2020-01-08 LAB — OB RESULTS CONSOLE HEPATITIS B SURFACE ANTIGEN: Hepatitis B Surface Ag: NEGATIVE

## 2020-01-08 MED ORDER — PROMETHAZINE HCL 25 MG PO TABS: 25.0000 mg | ORAL_TABLET | Freq: Four times a day (QID) | ORAL | 2 refills | Status: DC | PRN

## 2020-01-08 MED ORDER — SCOPOLAMINE 1 MG/3DAYS TD PT72: 1.0000 | MEDICATED_PATCH | TRANSDERMAL | 12 refills | Status: DC

## 2020-01-08 NOTE — Progress Notes (Signed)
Subjective:  Karen Soto is a G2P1001 [redacted]w[redacted]d being seen today for her first obstetrical visit.  Her obstetrical history is significant for single pregnancy with NSVD. Planned pregnancy. Patient does intend to breast feed. Pregnancy history fully reviewed.  Patient reports nausea and vomiting. Tried diclegis, but it wasn't helpful.  BP 113/86   Pulse 91   Wt 191 lb (86.6 kg)   LMP 10/29/2019   BMI 28.21 kg/m   HISTORY: OB History  Gravida Para Term Preterm AB Living  2 1 1  0 0 1  SAB TAB Ectopic Multiple Live Births  0 0 0 0 1    # Outcome Date GA Lbr Len/2nd Weight Sex Delivery Anes PTL Lv  2 Current           1 Term 08/10/15 [redacted]w[redacted]d 06:02 / 03:01 6 lb 12.3 oz (3.07 kg) M Vag-Spont EPI  LIV    Past Medical History:  Diagnosis Date  . Asthma   . Chlamydia   . Drooping eyelid 01/04/2012   right  . Headache(784.0) 01/08/2012   "lots since Monday (01/04/2012)"  . Obesity     Past Surgical History:  Procedure Laterality Date  . APPENDECTOMY  01/08/2012  . APPENDECTOMY    . INCISION AND DRAINAGE OF WOUND  ~ 2010   "MRSA in her back; had it drained @ the hospital"  . LAPAROSCOPIC APPENDECTOMY  01/08/2012   Procedure: APPENDECTOMY LAPAROSCOPIC;  Surgeon: 01/10/2012, MD;  Location: [facility] OR;  Service: General;  Laterality: N/A;    Family History  Problem Relation Age of Onset  . Hypertension Mother   . Stroke Mother   . Cancer Father        skin  . Prostate cancer Father   . Diabetes Paternal Grandmother      Exam  BP 113/86   Pulse 91   Wt 191 lb (86.6 kg)   LMP 10/29/2019   BMI 28.21 kg/m   Chaperone present during exam  CONSTITUTIONAL: Well-developed, well-nourished female in no acute distress.  HENT:  Normocephalic, atraumatic, External right and left ear normal. Oropharynx is clear and moist EYES: Conjunctivae and EOM are normal. Pupils are equal, round, and reactive to light. No scleral icterus.  NECK: Normal range of motion, supple, no masses.  Normal  thyroid.  CARDIOVASCULAR: Normal heart rate noted, regular rhythm RESPIRATORY: Clear to auscultation bilaterally. Effort and breath sounds normal, no problems with respiration noted. BREASTS: declined ABDOMEN: Soft, normal bowel sounds, no distention noted.  No tenderness, rebound or guarding.  PELVIC: Normal appearing external genitalia; normal appearing vaginal mucosa and cervix. No abnormal discharge noted. Normal uterine size, no other palpable masses, no uterine or adnexal tenderness. MUSCULOSKELETAL: Normal range of motion. No tenderness.  No cyanosis, clubbing, or edema.  2+ distal pulses. SKIN: Skin is warm and dry. No rash noted. Not diaphoretic. No erythema. No pallor. NEUROLOGIC: Alert and oriented to person, place, and time. Normal reflexes, muscle tone coordination. No cranial nerve deficit noted. PSYCHIATRIC: Normal mood and affect. Normal behavior. Normal judgment and thought content.    Assessment:    Pregnancy: G2P1001 Patient Active Problem List   Diagnosis Date Noted  . Supervision of other normal pregnancy, antepartum 01/08/2020  . Obesity in pregnancy, antepartum 05/15/2015      Plan:   1. [redacted] weeks gestation of pregnancy - Urine Culture - Hemoglobpathy+Fer w/A Thal Rfx - Cytology - PAP( [place]) - Hemoglobin A1c - Enroll Patient in Babyscripts - 05/17/2015 MFM OB DETAIL +  14 WK; Future - CBC/D/Plt+RPR+Rh+ABO+Rub Ab...  2. History of gestational diabetes in prior pregnancy, currently pregnant HgA1c  3. Supervision of other normal pregnancy, antepartum Desires genetic testing Would like PP BTL.  Scopolamine for n/v. Also add phenergan.     Problem list reviewed and updated. 75% of 30 min visit spent on counseling and coordination of care.     Levie Heritage 01/08/2020

## 2020-01-08 NOTE — Progress Notes (Signed)
  Patient had confirmatory ultrasound in MAU on 12/20/2019. Armandina Stammer RN

## 2020-01-09 LAB — CBC/D/PLT+RPR+RH+ABO+RUB AB...
Antibody Screen: NEGATIVE
Basophils Absolute: 0.1 10*3/uL (ref 0.0–0.2)
Basos: 1 %
EOS (ABSOLUTE): 0.1 10*3/uL (ref 0.0–0.4)
Eos: 1 %
HCV Ab: <0.1 s/co ratio (ref 0.0–0.9)
HIV Screen 4th Generation wRfx: NONREACTIVE
Hematocrit: 39.6 % (ref 34.0–46.6)
Hemoglobin: 12.8 g/dL (ref 11.1–15.9)
Hepatitis B Surface Ag: NEGATIVE
Immature Grans (Abs): 0 10*3/uL (ref 0.0–0.1)
Immature Granulocytes: 0 %
Lymphocytes Absolute: 2 10*3/uL (ref 0.7–3.1)
Lymphs: 27 %
MCH: 25.6 pg — ABNORMAL LOW (ref 26.6–33.0)
MCHC: 32.3 g/dL (ref 31.5–35.7)
MCV: 79 fL (ref 79–97)
Monocytes Absolute: 0.6 10*3/uL (ref 0.1–0.9)
Monocytes: 8 %
Neutrophils Absolute: 4.7 10*3/uL (ref 1.4–7.0)
Neutrophils: 63 %
Platelets: 292 10*3/uL (ref 150–450)
RBC: 5 x10E6/uL (ref 3.77–5.28)
RDW: 13.8 % (ref 11.7–15.4)
RPR Ser Ql: NONREACTIVE
Rh Factor: NEGATIVE
Rubella Antibodies, IGG: 1.27 index (ref >0.99)
WBC: 7.4 10*3/uL (ref 3.4–10.8)

## 2020-01-09 LAB — HCV INTERPRETATION

## 2020-01-09 LAB — HEMOGLOBIN A1C
Est. average glucose Bld gHb Est-mCnc: 103 mg/dL
Hgb A1c MFr Bld: 5.2 % (ref 4.8–5.6)

## 2020-01-10 LAB — CYTOLOGY - PAP
Chlamydia: NEGATIVE
Comment: NEGATIVE
Comment: NORMAL
Diagnosis: NEGATIVE
Neisseria Gonorrhea: NEGATIVE

## 2020-01-10 LAB — URINE CULTURE: Organism ID, Bacteria: NO GROWTH

## 2020-01-18 ENCOUNTER — Encounter: Payer: Self-pay | Admitting: Family Medicine

## 2020-01-18 DIAGNOSIS — D563 Thalassemia minor: Secondary | ICD-10-CM

## 2020-01-18 HISTORY — DX: Thalassemia minor: D56.3

## 2020-01-18 LAB — HEMOGLOBPATHY+FER W/A THAL RFX
Ferritin: 59 ng/mL (ref 15–150)
Hematocrit: 37.9 % (ref 34.0–46.6)
Hemoglobin: 13.1 g/dL (ref 11.1–15.9)
Hgb A2: 2.5 % (ref 1.8–3.2)
Hgb A: 97.5 % (ref 96.4–98.8)
Hgb F: 0 % (ref 0.0–2.0)
Hgb S: 0 %
MCH: 27 pg (ref 26.6–33.0)
MCHC: 34.6 g/dL (ref 31.5–35.7)
MCV: 78 fL — ABNORMAL LOW (ref 79–97)
Platelets: 279 10*3/uL (ref 150–450)
RBC: 4.85 x10E6/uL (ref 3.77–5.28)
RDW: 13.9 % (ref 11.7–15.4)
WBC: 7.6 10*3/uL (ref 3.4–10.8)

## 2020-01-18 LAB — ALPHA-THALASSEMIA

## 2020-01-18 NOTE — Addendum Note (Signed)
Addended by: Levie Heritage on: 01/18/2020 04:51 PM   Modules accepted: Orders

## 2020-02-13 ENCOUNTER — Encounter: Payer: Self-pay | Admitting: Advanced Practice Midwife

## 2020-02-13 ENCOUNTER — Other Ambulatory Visit: Payer: Self-pay

## 2020-02-13 ENCOUNTER — Ambulatory Visit (INDEPENDENT_AMBULATORY_CARE_PROVIDER_SITE_OTHER): Payer: Medicaid Other | Admitting: Advanced Practice Midwife

## 2020-02-13 VITALS — BP 121/80 | HR 85 | Wt 194.1 lb

## 2020-02-13 DIAGNOSIS — Z3480 Encounter for supervision of other normal pregnancy, unspecified trimester: Secondary | ICD-10-CM

## 2020-02-13 DIAGNOSIS — O219 Vomiting of pregnancy, unspecified: Secondary | ICD-10-CM

## 2020-02-13 DIAGNOSIS — D563 Thalassemia minor: Secondary | ICD-10-CM

## 2020-02-13 DIAGNOSIS — Z8632 Personal history of gestational diabetes: Secondary | ICD-10-CM

## 2020-02-13 DIAGNOSIS — O09299 Supervision of pregnancy with other poor reproductive or obstetric history, unspecified trimester: Secondary | ICD-10-CM

## 2020-02-13 NOTE — Patient Instructions (Signed)

## 2020-02-13 NOTE — Progress Notes (Signed)
   PRENATAL VISIT NOTE  Subjective:  Karen Soto is a 29 y.o. G2P1001 at [redacted]w[redacted]d being seen today for ongoing prenatal care.  She is currently monitored for the following issues for this low-risk pregnancy and has Obesity in pregnancy, antepartum; Supervision of other normal pregnancy, antepartum; and Alpha thalassemia trait on their problem list.  Patient reports nausea better, some round ligament pains.   .  .  Movement: Absent. Denies leaking of fluid.   The following portions of the patient's history were reviewed and updated as appropriate: allergies, current medications, past family history, past medical history, past social history, past surgical history and problem list.   Objective:   Vitals:   02/13/20 1043  BP: 121/80  Pulse: 85  Weight: 194 lb 1.6 oz (88 kg)    Fetal Status:     Movement: Absent     General:  Alert, oriented and cooperative. Patient is in no acute distress.  Skin: Skin is warm and dry. No rash noted.   Cardiovascular: Normal heart rate noted  Respiratory: Normal respiratory effort, no problems with respiration noted  Abdomen: Soft, gravid, appropriate for gestational age.  Pain/Pressure: Absent     Pelvic: Cervical exam deferred        Extremities: Normal range of motion.  Edema: None  Mental Status: Normal mood and affect. Normal behavior. Normal judgment and thought content.   Assessment and Plan:  Pregnancy: G2P1001 at [redacted]w[redacted]d 1. History of gestational diabetes in prior pregnancy, currently pregnant     HgbA1C 5.2  2. Alpha thalassemia trait     Carrier status, scheduled for genetic counseling    Discussed FOB can get [facility] at [facility]  3. Supervision of other normal pregnancy, antepartum     [place] scheduled for 10/19  4. Nausea and vomiting during pregnancy prior to [redacted] weeks gestation     Doing better, no further nausea  Preterm labor symptoms and general obstetric precautions including but not limited to vaginal bleeding, contractions, leaking of  fluid and fetal movement were reviewed in detail with the patient. Please refer to After Visit Summary for other counseling recommendations.   No follow-ups on file.  Future Appointments  Date Time Provider Department Center  03/12/2020 12:45 PM [facility] NURSE [facility]  03/12/2020  1:00 PM [facility] US1 [facility]  03/12/2020  2:30 PM [facility] GENETIC COUNSELING RM [facility]  03/14/2020  9:00 AM Willodean Rosenthal, MD [facility] None    Wynelle Bourgeois, CNM

## 2020-03-12 ENCOUNTER — Ambulatory Visit: Payer: Medicaid Other | Attending: Family Medicine

## 2020-03-12 ENCOUNTER — Other Ambulatory Visit: Payer: Self-pay

## 2020-03-12 ENCOUNTER — Other Ambulatory Visit: Payer: Self-pay | Admitting: *Deleted

## 2020-03-12 ENCOUNTER — Ambulatory Visit: Payer: Medicaid Other | Admitting: *Deleted

## 2020-03-12 ENCOUNTER — Ambulatory Visit (HOSPITAL_BASED_OUTPATIENT_CLINIC_OR_DEPARTMENT_OTHER): Payer: Medicaid Other | Admitting: Genetic Counselor

## 2020-03-12 ENCOUNTER — Ambulatory Visit: Payer: Self-pay | Admitting: Genetic Counselor

## 2020-03-12 ENCOUNTER — Encounter: Payer: Self-pay | Admitting: *Deleted

## 2020-03-12 VITALS — BP 130/72 | HR 87

## 2020-03-12 DIAGNOSIS — O283 Abnormal ultrasonic finding on antenatal screening of mother: Secondary | ICD-10-CM

## 2020-03-12 DIAGNOSIS — O24419 Gestational diabetes mellitus in pregnancy, unspecified control: Secondary | ICD-10-CM

## 2020-03-12 DIAGNOSIS — D563 Thalassemia minor: Secondary | ICD-10-CM | POA: Insufficient documentation

## 2020-03-12 DIAGNOSIS — Z315 Encounter for genetic counseling: Secondary | ICD-10-CM | POA: Insufficient documentation

## 2020-03-12 DIAGNOSIS — Z3A10 10 weeks gestation of pregnancy: Secondary | ICD-10-CM | POA: Insufficient documentation

## 2020-03-12 DIAGNOSIS — Z36 Encounter for antenatal screening for chromosomal anomalies: Secondary | ICD-10-CM | POA: Insufficient documentation

## 2020-03-12 DIAGNOSIS — Z362 Encounter for other antenatal screening follow-up: Secondary | ICD-10-CM

## 2020-03-12 NOTE — Progress Notes (Signed)
03/12/2020  Karen Soto Oct 05, 1990 MRN: [id number] DOV: 03/12/2020  Ms. Karen Soto presented to the [facility] for a genetics consultation regarding her carrier status for alpha-thalassemia. Karen Soto presented to her appointment alone.   Indication for genetic counseling - Silent carrier for alpha-thalassemia  Prenatal history  Karen Soto is a G2P1001, 29 y.o. female. Her current pregnancy has completed [redacted]w[redacted]d (Estimated Date of Delivery: 08/04/20). Karen Soto and her partner have a four year old son together.  Karen Soto denied exposure to environmental toxins or chemical agents. She denied the use of alcohol, tobacco or street drugs. She reported taking burdock root, alfalfa leaf, moringa leaf, Irish moss, chlorella, amla, holy basil, parsley, achiote, licen, and folate. She denied significant viral illnesses, fevers, and bleeding during the course of her pregnancy. Her medical and surgical histories were noncontributory.  Family History  A three generation pedigree was drafted and reviewed. The family history is remarkable for the following:  - Karen Soto and her partner, Karen Soto, have family histories of cancer. Karen Soto father had squamous cell carcinoma, lung cancer, and bone cancer, with his earliest diagnosis at age 35. Of note, he was a smoker. Karen Soto paternal aunt and paternal grandmother had breast cancer, with her aunt's occurring earlier than the age of 42. Karen Soto paternal also aunt had cancer before the age of 36. Karen Soto was not sure which type of cancer this individual had. We discussed that most cancers are thought to be sporadic or due to environmental factors. However, some families appear to have a predisposition to cancers. When considering a family history of cancer, we look for common types of cancer in multiple family members occurring at younger than typical ages. We discussed the option of meeting with a cancer genetic  counselor to discuss any possible screening or testing options available. If they are concerned about the family history of cancer and would like to learn more about the family's chance for an inherited cancer syndrome, their or their healthcare providers may refer her to the [facility] [phone]).   - Ms. Geoffroy's mother had three miscarriages, two of which were twin pregnancies. Ms. Langland believed this was due to her mother's age and history of trauma, as she was 29 years old in her first pregnancy. Without further information, precise risk assessment is limited.   - Karen Soto father has a maternal half sister who has a disorder in which her blood does not clot properly. Her daughter reportedly has the same condition. There are several different genetic clotting disorders with multiple causes and modes of inheritance. Based on this family history, it appears that there may be an autosomal dominant blood disorder in Karen Soto family. Many dominant clotting disorders do not demonstrate reduced penetrance. Given that Mr. Laural Benes and his mother do not have any clotting abnormalities, it is unlikely that Karen Soto children are at increased risk for a clotting disorder. However, without further information, precise risk assessment is limited.   The remaining family histories were reviewed and found to be noncontributory for birth defects, intellectual disability, recurrent pregnancy loss, and known genetic conditions.    The patient's ancestry is [place], [place], and Native [address] ([place]). The father of the pregnancy's ethnicity is [place] American and Native [address]. Ashkenazi Jewish ancestry and consanguinity were denied. Pedigree will be scanned under Media.  Discussion  Alpha-thalassemia:  Karen Soto had carrier screening for alpha-thalassemia performed through [facility]. The results of the screen  identified her as a silent carrier for alpha-thalassemia (aa/a-).  Alpha-thalassemia is different in its inheritance compared to other hemoglobinopathies as there are two copies of two alpha globin genes (HBA1 and HBA2) on each chromosome 16, or four alpha globin genes total (aa/aa). A person can be a carrier of one alpha gene mutation (aa/a-), also referred to as a "silent carrier". A person who carries two alpha globin gene mutations can either carry them in cis (both on the same chromosome, denoted as aa/--) or in trans (on different chromosomes, denoted as a-/a-). Alpha-thalassemia carriers of two mutations who have African American ancestry are more likely to have a trans arrangement (a-/a-); cis configuration is reported to be rare in individuals with African American ancestry.     There are several different forms of alpha-thalassemia. The most severe form of alpha-thalassemia, Hb Barts, is associated with an absence of alpha globin chain synthesis as a result of deletions of all four alpha globin genes (--/--).  Given that Karen Soto is a silent carrier (aa/a-), her pregnancies would not be at increased risk for Hb Barts, even if her partner is a carrier for alpha-thalassemia, as she will always pass on at least one copy of the alpha globin gene to her children. Hemoglobin H (HbH) disease is caused by three deleted or dysfunctioning alpha globin alleles (a-/--) and is characterized by microcytic hypochromic hemolytic anemia, hepatosplenomegaly, mild jaundice, growth retardation, and sometimes thalassemia-like bone changes. Given Karen Soto silent carrier status (aa/a-), the current fetus would only be at risk for HbH disease (a-/--), if her partner is a carrier for two alpha globin mutations in cis (aa/--). If this is the case, the risk for HbH disease in the pregnancy would be 1 in 4 (25%). However, if Karen Soto partner is a carrier for two alpha globin mutations, he would be more likely to carry them in trans configuration (a-/a-) than the cis configuration (aa/--),  given his ethnicity. If he is a carrier of alpha-thalassemia in trans, then the pregnancy would not be at increased risk for HbH disease. Based on the carrier frequency for alpha-thalassemia in the African American population, Ms. Dimino partner has a 1 in 30 chance of being any type of carrier for alpha-thalassemia. We discussed that carrier screening for alpha-thalassemia is recommended for Ms. Smeltzer's partner. Ms. Avera indicated that she may be interested in pursuing partner carrier screening.  Other carrier screening:  Per ACOG recommendation, carrier screening for cystic fibrosis (CF), spinal muscular atrophy (SMA), and hemoglobinopathies was discussed including information about the conditions, rationale for testing, autosomal recessive inheritance, and the option of prenatal diagnosis. Ms. Pethtel previously had a negative hemoglobin electrophoresis, reducing her chances of being a carrier for a hemoglobinopathy. I offered additional carrier screening for CF and SMA, which Ms. Brandy declined at this time. Without carrier screening to refine risk and based on ethnicity alone, Ms. Graff has a 1 in 28 chance of being a carrier for SMA and a 1 in 61 chance of being a carrier for CF. She was informed that select hemoglobinopathies, CF, and SMA are included on [place]'s newborn screen.  Ultrasound findings:  A complete ultrasound was performed today prior to our visit. The ultrasound report will be sent under separate cover. An echogenic intracardiac focus (EIF) was identified on ultrasound. An echogenic intracardiac focus (EIF) is a bright spot seen in the heart on ultrasound. We discussed that an EIF is considered a soft marker for Down syndrome. EIF is present  in approximately 3-5% of chromosomally normal fetuses, but up to 30% of fetuses with Down syndrome. The likelihood ratio for fetal Down syndrome associated with EIF is 5.83 (Agathokleous et al., 2013). Given the identified soft marker, the  adjusted risk for fetal Down syndrome in the current pregnancy was recalculated from Ms. Depaula's 0.1% age-related risk to 0.8. There were no other visualized fetal anomalies or markers suggestive of aneuploidy on today's ultrasound. Ms. Spielberg understands that screening tests, including ultrasound, cannot rule out all birth defects or genetic syndromes.  Aneuploidy screening:  Ms. Spizzirri had not yet had aneuploidy screening during this pregnancy but expressed interest in pursuing this. We reviewed noninvasive prenatal screening (NIPS) as an available screening option. Specifically, we discussed that NIPS analyzes cell free DNA originating from the placenta that is found in the maternal blood circulation during pregnancy. This test is not diagnostic for chromosome conditions, but can provide information regarding the presence or absence of extra fetal DNA for chromosomes 13, 18, 21, and the sex chromosomes. Thus, it would not identify or rule out all fetal aneuploidy. The reported detection rate is 91-99% for trisomies 21, 18, 13, and sex chromosome aneuploidies. The false positive rate is reported to be less than 0.1% for any of these conditions. Ms. Stoneman indicated that she is interested in undergoing NIPS. However, she preferred to have NIPS for trisomies 21, 50, and 13 only without analysis for sex chromosome aneuploidies.  Diagnostic testing:  Ms. Alan was also counseled regarding diagnostic testing via amniocentesis. We discussed the technical aspects of the procedure and quoted up to a 1 in 500 (0.2%) risk for spontaneous pregnancy loss or other adverse pregnancy outcomes as a result of amniocentesis. Cultured cells from an amniocentesis sample allow for the visualization of a fetal karyotype, which can detect >99% of large chromosomal aberrations. Chromosomal microarray can also be performed to identify smaller deletions or duplications of fetal chromosomal material. Amniocentesis could also be  performed to assess whether the baby is affected by alpha-thalassemia. After careful consideration, Ms. Haseman declined amniocentesis at this time. She understands that amniocentesis is available at any point after 16 weeks of pregnancy and that she may opt to undergo the procedure at a later date should she change her mind.  Plan:  Ms. Halley had her blood drawn for MaterniT21 NIPS today. Results will take approximately one week to be returned. I will call Ms. Rolland once results become available.   Ms. Kohut will also speak with her partner about partner carrier screening for alpha-thalassemia. I provided her with my contact information and encouraged her to reach out to me to coordinate testing if her partner is interested.  I counseled Ms. Luft regarding the above risks and available options. Second year [employer] genetic counseling student Jacklynn Lewis participated in portions of this session under my supervision. The approximate face-to-face time with the genetic counselor was 50 minutes.  In summary:  Discussed carrier screening results and options for follow-up testing  Silent carrier for alpha-thalassemia  Recommended partner carrier screening. She will discuss this with her partner and contact me to facilitate testing if interested  Reviewed results of ultrasound  Echogenic intracardiac focus identified. Discussed that this is soft marker for Down syndrome  No other fetal anomalies or markers seen  Offered additional testing and screening  Opted to undergo MaterniT21 NIPS for trisomies 21,18, & 13. We will follow results  Declined carrier screening for cystic fibrosis and spinal muscular atrophy  Declined amniocentesis  Reviewed  family history concerns   Gershon CraneHaley E Markell Schrier, MS, [employer] Genetic Counselor

## 2020-03-14 ENCOUNTER — Encounter: Payer: Medicaid Other | Admitting: Obstetrics & Gynecology

## 2020-03-17 LAB — MATERNIT21 PLUS CORE NO GENDER
Fetal Fraction: 9
Result (T21): NEGATIVE
Trisomy 13 (Patau syndrome): NEGATIVE
Trisomy 18 (Edwards syndrome): NEGATIVE
Trisomy 21 (Down syndrome): NEGATIVE

## 2020-03-19 ENCOUNTER — Telehealth: Payer: Self-pay | Admitting: Genetic Counselor

## 2020-03-19 NOTE — Telephone Encounter (Signed)
Second year [employer] genetic counseling student Karen Soto called Karen Soto to discuss her negative noninvasive prenatal screening (NIPS) result under my supervision. Specifically, Karen Soto had MaterniT21 testing through [facility]. Testing was offered because of an echogenic intracardiac focus (EIF) identified on ultrasound. These negative results demonstrated an expected representation of chromosome 21, 18, and 13, greatly reducing the likelihood of trisomies 21, 13, or 18 for the pregnancy. Karen Soto elected not to have analysis for sex chromosome aneuploidies. We reviewed that that the EIF on ultrasound is likely a normal variant in the context of these low-risk NIPS result.  NIPS analyzes placental (fetal) DNA in maternal circulation. NIPS is considered to be highly specific and sensitive, but is not considered to be a diagnostic test. This testing identifies 91-99% of pregnancies with trisomies 21, 13, and 18, but does not test for all genetic conditions. Diagnostic testing via amniocentesis is available should she be interested in confirming this result. She confirmed that she had no questions about these results at this time.  Gershon Crane, MS, [facility] Genetic Counselor

## 2020-03-28 ENCOUNTER — Ambulatory Visit (INDEPENDENT_AMBULATORY_CARE_PROVIDER_SITE_OTHER): Payer: Medicaid Other | Admitting: Family Medicine

## 2020-03-28 ENCOUNTER — Other Ambulatory Visit: Payer: Self-pay

## 2020-03-28 VITALS — BP 102/64 | HR 76 | Wt 221.0 lb

## 2020-03-28 DIAGNOSIS — Z3A21 21 weeks gestation of pregnancy: Secondary | ICD-10-CM

## 2020-03-28 DIAGNOSIS — Z3480 Encounter for supervision of other normal pregnancy, unspecified trimester: Secondary | ICD-10-CM

## 2020-03-28 NOTE — Progress Notes (Signed)
   PRENATAL VISIT NOTE  Subjective:  Karen Soto is a 29 y.o. G2P1001 at [redacted]w[redacted]d being seen today for ongoing prenatal care.  She is currently monitored for the following issues for this low-risk pregnancy and has Obesity in pregnancy, antepartum; Supervision of other normal pregnancy, antepartum; and Alpha thalassemia trait on their problem list.  Patient reports no complaints.  Contractions: Not present. Vag. Bleeding: None.  Movement: Present. Denies leaking of fluid.   The following portions of the patient's history were reviewed and updated as appropriate: allergies, current medications, past family history, past medical history, past social history, past surgical history and problem list.   Objective:   Vitals:   03/28/20 1602  BP: 102/64  Pulse: 76  Weight: 221 lb (100.2 kg)    Fetal Status: Fetal Heart Rate (bpm): 145   Movement: Present     General:  Alert, oriented and cooperative. Patient is in no acute distress.  Skin: Skin is warm and dry. No rash noted.   Cardiovascular: Normal heart rate noted  Respiratory: Normal respiratory effort, no problems with respiration noted  Abdomen: Soft, gravid, appropriate for gestational age.  Pain/Pressure: Absent     Pelvic: Cervical exam deferred        Extremities: Normal range of motion.  Edema: None  Mental Status: Normal mood and affect. Normal behavior. Normal judgment and thought content.   Assessment and Plan:  Pregnancy: G2P1001 at [redacted]w[redacted]d 1. Supervision of other normal pregnancy, antepartum FHT and FH normal  2. [redacted] weeks gestation of pregnancy   Preterm labor symptoms and general obstetric precautions including but not limited to vaginal bleeding, contractions, leaking of fluid and fetal movement were reviewed in detail with the patient. Please refer to After Visit Summary for other counseling recommendations.   Return in about 4 weeks (around 04/25/2020) for 2 hr GTT, OB f/u.  Future Appointments  Date Time Provider  Department Center  04/09/2020 11:00 AM [facility] NURSE [facility]  04/09/2020 11:15 AM [facility] US2 [facility]  04/25/2020  8:15 AM Adrian Blackwater, Rhona Raider, DO [facility] None    Levie Heritage, DO

## 2020-04-09 ENCOUNTER — Other Ambulatory Visit: Payer: Self-pay | Admitting: *Deleted

## 2020-04-09 ENCOUNTER — Other Ambulatory Visit: Payer: Self-pay | Admitting: Maternal & Fetal Medicine

## 2020-04-09 ENCOUNTER — Ambulatory Visit: Payer: Medicaid Other | Attending: Obstetrics and Gynecology

## 2020-04-09 ENCOUNTER — Ambulatory Visit: Payer: Medicaid Other | Admitting: *Deleted

## 2020-04-09 ENCOUNTER — Other Ambulatory Visit: Payer: Self-pay

## 2020-04-09 ENCOUNTER — Encounter: Payer: Self-pay | Admitting: *Deleted

## 2020-04-09 DIAGNOSIS — Z3480 Encounter for supervision of other normal pregnancy, unspecified trimester: Secondary | ICD-10-CM | POA: Insufficient documentation

## 2020-04-09 DIAGNOSIS — Z3A23 23 weeks gestation of pregnancy: Secondary | ICD-10-CM

## 2020-04-09 DIAGNOSIS — Z148 Genetic carrier of other disease: Secondary | ICD-10-CM

## 2020-04-09 DIAGNOSIS — O09292 Supervision of pregnancy with other poor reproductive or obstetric history, second trimester: Secondary | ICD-10-CM

## 2020-04-09 DIAGNOSIS — E669 Obesity, unspecified: Secondary | ICD-10-CM | POA: Diagnosis not present

## 2020-04-09 DIAGNOSIS — Z362 Encounter for other antenatal screening follow-up: Secondary | ICD-10-CM

## 2020-04-09 DIAGNOSIS — O358XX0 Maternal care for other (suspected) fetal abnormality and damage, not applicable or unspecified: Secondary | ICD-10-CM | POA: Diagnosis not present

## 2020-04-09 DIAGNOSIS — O99212 Obesity complicating pregnancy, second trimester: Secondary | ICD-10-CM | POA: Diagnosis not present

## 2020-04-24 ENCOUNTER — Ambulatory Visit: Payer: Medicaid Other

## 2020-04-24 ENCOUNTER — Ambulatory Visit: Payer: Medicaid Other | Attending: Maternal & Fetal Medicine

## 2020-04-25 ENCOUNTER — Other Ambulatory Visit (HOSPITAL_COMMUNITY)
Admission: RE | Admit: 2020-04-25 | Discharge: 2020-04-25 | Disposition: A | Discharge status: Home / Self Care | Payer: Medicaid Other | Source: Ambulatory Visit | Attending: Family Medicine | Admitting: Family Medicine

## 2020-04-25 ENCOUNTER — Ambulatory Visit (INDEPENDENT_AMBULATORY_CARE_PROVIDER_SITE_OTHER): Payer: Medicaid Other | Admitting: Family Medicine

## 2020-04-25 ENCOUNTER — Other Ambulatory Visit: Payer: Self-pay

## 2020-04-25 ENCOUNTER — Encounter: Payer: Medicaid Other | Admitting: Family Medicine

## 2020-04-25 VITALS — BP 109/72 | HR 86 | Wt 229.0 lb

## 2020-04-25 DIAGNOSIS — B373 Candidiasis of vulva and vagina: Secondary | ICD-10-CM | POA: Diagnosis present

## 2020-04-25 DIAGNOSIS — Z3A25 25 weeks gestation of pregnancy: Secondary | ICD-10-CM | POA: Insufficient documentation

## 2020-04-25 DIAGNOSIS — O365990 Maternal care for other known or suspected poor fetal growth, unspecified trimester, not applicable or unspecified: Secondary | ICD-10-CM

## 2020-04-25 DIAGNOSIS — D563 Thalassemia minor: Secondary | ICD-10-CM

## 2020-04-25 DIAGNOSIS — O23592 Infection of other part of genital tract in pregnancy, second trimester: Secondary | ICD-10-CM | POA: Diagnosis not present

## 2020-04-25 DIAGNOSIS — B3731 Acute candidiasis of vulva and vagina: Secondary | ICD-10-CM

## 2020-04-25 DIAGNOSIS — Z3480 Encounter for supervision of other normal pregnancy, unspecified trimester: Secondary | ICD-10-CM

## 2020-04-25 MED ORDER — MICONAZOLE NITRATE 2 % VA CREA
1.0000 | TOPICAL_CREAM | Freq: Every day | VAGINAL | 2 refills | Status: DC
Start: 2020-04-25 — End: 2020-06-11

## 2020-04-25 NOTE — Progress Notes (Signed)
   PRENATAL VISIT NOTE  Subjective:  Karen Soto is a 29 y.o. G2P1001 at [redacted]w[redacted]d being seen today for ongoing prenatal care.  She is currently monitored for the following issues for this low-risk pregnancy and has Obesity in pregnancy, antepartum; Supervision of other normal pregnancy, antepartum; and Alpha thalassemia trait on their problem list.  Patient reports vaginal irritation.  Contractions: Not present. Vag. Bleeding: None.  Movement: Present. Denies leaking of fluid.   The following portions of the patient's history were reviewed and updated as appropriate: allergies, current medications, past family history, past medical history, past social history, past surgical history and problem list.   Objective:   Vitals:   04/25/20 0817  BP: 109/72  Pulse: 86  Weight: 229 lb (103.9 kg)    Fetal Status: Fetal Heart Rate (bpm): 143   Movement: Present     General:  Alert, oriented and cooperative. Patient is in no acute distress.  Skin: Skin is warm and dry. No rash noted.   Cardiovascular: Normal heart rate noted  Respiratory: Normal respiratory effort, no problems with respiration noted  Abdomen: Soft, gravid, appropriate for gestational age.  Pain/Pressure: Absent     Pelvic: Cervical exam performed in the presence of a chaperone      Irritation to introitus  Extremities: Normal range of motion.  Edema: None  Mental Status: Normal mood and affect. Normal behavior. Normal judgment and thought content.   Assessment and Plan:  Pregnancy: G2P1001 at [redacted]w[redacted]d 1. [redacted] weeks gestation of pregnancy  2. Supervision of other normal pregnancy, antepartum FHT and HF normal  3. Vulvovaginitis due to yeast miconazole  4. Alpha thalassemia trait  5. Fetal growth restriction Serial [place]. Growth in 9th percentile.  Preterm labor symptoms and general obstetric precautions including but not limited to vaginal bleeding, contractions, leaking of fluid and fetal movement were reviewed in detail  with the patient. Please refer to After Visit Summary for other counseling recommendations.   Return in about 4 weeks (around 05/23/2020) for OB f/u, 2 hr GTT.  Future Appointments  Date Time Provider Department Center  04/30/2020  7:15 AM [facility] NURSE [facility]  04/30/2020  7:30 AM [facility] US3 [facility]  05/30/2020  8:15 AM Adrian Blackwater, Rhona Raider, DO [facility] None    Levie Heritage, DO

## 2020-04-26 LAB — CERVICOVAGINAL ANCILLARY ONLY
Candida Glabrata: NEGATIVE
Candida Vaginitis: POSITIVE — AB
Comment: NEGATIVE
Comment: NEGATIVE

## 2020-04-30 ENCOUNTER — Ambulatory Visit: Payer: Medicaid Other | Admitting: *Deleted

## 2020-04-30 ENCOUNTER — Other Ambulatory Visit: Payer: Self-pay

## 2020-04-30 ENCOUNTER — Ambulatory Visit: Payer: Medicaid Other | Attending: Maternal & Fetal Medicine

## 2020-04-30 ENCOUNTER — Other Ambulatory Visit: Payer: Self-pay | Admitting: *Deleted

## 2020-04-30 ENCOUNTER — Encounter: Payer: Self-pay | Admitting: *Deleted

## 2020-04-30 DIAGNOSIS — O09292 Supervision of pregnancy with other poor reproductive or obstetric history, second trimester: Secondary | ICD-10-CM | POA: Diagnosis not present

## 2020-04-30 DIAGNOSIS — O365920 Maternal care for other known or suspected poor fetal growth, second trimester, not applicable or unspecified: Secondary | ICD-10-CM | POA: Diagnosis not present

## 2020-04-30 DIAGNOSIS — O321XX0 Maternal care for breech presentation, not applicable or unspecified: Secondary | ICD-10-CM

## 2020-04-30 DIAGNOSIS — E669 Obesity, unspecified: Secondary | ICD-10-CM

## 2020-04-30 DIAGNOSIS — Z3480 Encounter for supervision of other normal pregnancy, unspecified trimester: Secondary | ICD-10-CM

## 2020-04-30 DIAGNOSIS — O358XX0 Maternal care for other (suspected) fetal abnormality and damage, not applicable or unspecified: Secondary | ICD-10-CM

## 2020-04-30 DIAGNOSIS — Z362 Encounter for other antenatal screening follow-up: Secondary | ICD-10-CM

## 2020-04-30 DIAGNOSIS — O99212 Obesity complicating pregnancy, second trimester: Secondary | ICD-10-CM

## 2020-04-30 DIAGNOSIS — Z3A26 26 weeks gestation of pregnancy: Secondary | ICD-10-CM

## 2020-04-30 DIAGNOSIS — Z148 Genetic carrier of other disease: Secondary | ICD-10-CM

## 2020-05-09 ENCOUNTER — Other Ambulatory Visit: Payer: Self-pay

## 2020-05-09 ENCOUNTER — Ambulatory Visit (INDEPENDENT_AMBULATORY_CARE_PROVIDER_SITE_OTHER): Payer: Medicaid Other | Admitting: Family Medicine

## 2020-05-09 VITALS — BP 118/70 | HR 65 | Wt 236.0 lb

## 2020-05-09 DIAGNOSIS — Z6791 Unspecified blood type, Rh negative: Secondary | ICD-10-CM

## 2020-05-09 DIAGNOSIS — Z3A27 27 weeks gestation of pregnancy: Secondary | ICD-10-CM

## 2020-05-09 DIAGNOSIS — O26892 Other specified pregnancy related conditions, second trimester: Secondary | ICD-10-CM

## 2020-05-09 DIAGNOSIS — Z3480 Encounter for supervision of other normal pregnancy, unspecified trimester: Secondary | ICD-10-CM

## 2020-05-09 DIAGNOSIS — O26899 Other specified pregnancy related conditions, unspecified trimester: Secondary | ICD-10-CM | POA: Diagnosis not present

## 2020-05-09 MED ORDER — RHO D IMMUNE GLOBULIN 1500 UNIT/2ML IJ SOSY
300.0000 ug | PREFILLED_SYRINGE | Freq: Once | INTRAMUSCULAR | Status: AC
  Administered 2020-05-09: 09:00:00 300 ug via INTRAMUSCULAR

## 2020-05-09 NOTE — Progress Notes (Signed)
   PRENATAL VISIT NOTE  Subjective:  Karen Soto is a 29 y.o. G2P1001 at [redacted]w[redacted]d being seen today for ongoing prenatal care.  She is currently monitored for the following issues for this low-risk pregnancy and has Obesity in pregnancy, antepartum; Rh negative state in antepartum period; Supervision of other normal pregnancy, antepartum; and Alpha thalassemia trait on their problem list.  Patient reports no complaints.  Contractions: Not present. Vag. Bleeding: None.  Movement: Present. Denies leaking of fluid.   The following portions of the patient's history were reviewed and updated as appropriate: allergies, current medications, past family history, past medical history, past social history, past surgical history and problem list.   Objective:   Vitals:   05/09/20 0838  BP: 118/70  Pulse: 65  Weight: 236 lb (107 kg)    Fetal Status: Fetal Heart Rate (bpm): 138   Movement: Present     General:  Alert, oriented and cooperative. Patient is in no acute distress.  Skin: Skin is warm and dry. No rash noted.   Cardiovascular: Normal heart rate noted  Respiratory: Normal respiratory effort, no problems with respiration noted  Abdomen: Soft, gravid, appropriate for gestational age.  Pain/Pressure: Present     Pelvic: Cervical exam deferred        Extremities: Normal range of motion.  Edema: None  Mental Status: Normal mood and affect. Normal behavior. Normal judgment and thought content.   Assessment and Plan:  Pregnancy: G2P1001 at [redacted]w[redacted]d 1. [redacted] weeks gestation of pregnancy - CBC - Glucose Tolerance, 2 Hours w/1 Hour - HIV Antibody (routine testing w rflx) - RPR  2. Supervision of other normal pregnancy, antepartum FHT and FH normal - CBC - Glucose Tolerance, 2 Hours w/1 Hour - HIV Antibody (routine testing w rflx) - RPR  3. Rh negative state in antepartum period Rhogam today  Preterm labor symptoms and general obstetric precautions including but not limited to vaginal  bleeding, contractions, leaking of fluid and fetal movement were reviewed in detail with the patient. Please refer to After Visit Summary for other counseling recommendations.   No follow-ups on file.  Future Appointments  Date Time Provider Department Center  05/15/2020  7:30 AM [facility] NURSE [facility]  05/15/2020  7:45 AM [facility] US6 [facility]  05/22/2020  7:15 AM [facility] NURSE [facility]  05/22/2020  7:30 AM [facility] US3 [facility]    Levie Heritage, DO

## 2020-05-10 LAB — GLUCOSE TOLERANCE, 2 HOURS W/ 1HR
Glucose, 1 hour: 100 mg/dL (ref 65–179)
Glucose, 2 hour: 76 mg/dL (ref 65–152)
Glucose, Fasting: 68 mg/dL (ref 65–91)

## 2020-05-10 LAB — CBC
Hematocrit: 32.4 % — ABNORMAL LOW (ref 34.0–46.6)
Hemoglobin: 10.8 g/dL — ABNORMAL LOW (ref 11.1–15.9)
MCH: 26.9 pg (ref 26.6–33.0)
MCHC: 33.3 g/dL (ref 31.5–35.7)
MCV: 81 fL (ref 79–97)
Platelets: 280 10*3/uL (ref 150–450)
RBC: 4.01 x10E6/uL (ref 3.77–5.28)
RDW: 12.8 % (ref 11.7–15.4)
WBC: 8.5 10*3/uL (ref 3.4–10.8)

## 2020-05-10 LAB — RPR: RPR Ser Ql: NONREACTIVE

## 2020-05-10 LAB — HIV ANTIBODY (ROUTINE TESTING W REFLEX): HIV Screen 4th Generation wRfx: NONREACTIVE

## 2020-05-11 ENCOUNTER — Encounter: Payer: Self-pay | Admitting: Family Medicine

## 2020-05-11 DIAGNOSIS — D509 Iron deficiency anemia, unspecified: Secondary | ICD-10-CM | POA: Insufficient documentation

## 2020-05-11 MED ORDER — FERROUS GLUCONATE 324 (38 FE) MG PO TABS: 324.0000 mg | ORAL_TABLET | ORAL | 3 refills | Status: DC

## 2020-05-11 NOTE — Addendum Note (Signed)
Addended by: Levie Heritage on: 05/11/2020 04:11 PM   Modules accepted: Orders

## 2020-05-15 ENCOUNTER — Encounter: Payer: Self-pay | Admitting: *Deleted

## 2020-05-15 ENCOUNTER — Other Ambulatory Visit: Payer: Self-pay

## 2020-05-15 ENCOUNTER — Ambulatory Visit: Payer: Medicaid Other | Attending: Obstetrics and Gynecology

## 2020-05-15 ENCOUNTER — Ambulatory Visit: Payer: Medicaid Other | Admitting: *Deleted

## 2020-05-15 DIAGNOSIS — E669 Obesity, unspecified: Secondary | ICD-10-CM

## 2020-05-15 DIAGNOSIS — Z3A28 28 weeks gestation of pregnancy: Secondary | ICD-10-CM

## 2020-05-15 DIAGNOSIS — Z148 Genetic carrier of other disease: Secondary | ICD-10-CM

## 2020-05-15 DIAGNOSIS — O99213 Obesity complicating pregnancy, third trimester: Secondary | ICD-10-CM | POA: Diagnosis not present

## 2020-05-15 DIAGNOSIS — Z3480 Encounter for supervision of other normal pregnancy, unspecified trimester: Secondary | ICD-10-CM

## 2020-05-15 DIAGNOSIS — O365920 Maternal care for other known or suspected poor fetal growth, second trimester, not applicable or unspecified: Secondary | ICD-10-CM | POA: Diagnosis not present

## 2020-05-15 DIAGNOSIS — O358XX0 Maternal care for other (suspected) fetal abnormality and damage, not applicable or unspecified: Secondary | ICD-10-CM | POA: Diagnosis not present

## 2020-05-15 DIAGNOSIS — O09293 Supervision of pregnancy with other poor reproductive or obstetric history, third trimester: Secondary | ICD-10-CM

## 2020-05-15 DIAGNOSIS — Z362 Encounter for other antenatal screening follow-up: Secondary | ICD-10-CM

## 2020-05-22 ENCOUNTER — Other Ambulatory Visit: Payer: Self-pay | Admitting: *Deleted

## 2020-05-22 ENCOUNTER — Other Ambulatory Visit: Payer: Self-pay

## 2020-05-22 ENCOUNTER — Ambulatory Visit: Payer: Medicaid Other | Admitting: *Deleted

## 2020-05-22 ENCOUNTER — Ambulatory Visit: Payer: Medicaid Other | Attending: Obstetrics and Gynecology

## 2020-05-22 ENCOUNTER — Encounter: Payer: Self-pay | Admitting: *Deleted

## 2020-05-22 DIAGNOSIS — O365920 Maternal care for other known or suspected poor fetal growth, second trimester, not applicable or unspecified: Secondary | ICD-10-CM | POA: Insufficient documentation

## 2020-05-22 DIAGNOSIS — O09293 Supervision of pregnancy with other poor reproductive or obstetric history, third trimester: Secondary | ICD-10-CM | POA: Diagnosis not present

## 2020-05-22 DIAGNOSIS — Z3480 Encounter for supervision of other normal pregnancy, unspecified trimester: Secondary | ICD-10-CM

## 2020-05-22 DIAGNOSIS — E669 Obesity, unspecified: Secondary | ICD-10-CM

## 2020-05-22 DIAGNOSIS — Z148 Genetic carrier of other disease: Secondary | ICD-10-CM

## 2020-05-22 DIAGNOSIS — O358XX0 Maternal care for other (suspected) fetal abnormality and damage, not applicable or unspecified: Secondary | ICD-10-CM

## 2020-05-22 DIAGNOSIS — O99213 Obesity complicating pregnancy, third trimester: Secondary | ICD-10-CM | POA: Diagnosis not present

## 2020-05-22 DIAGNOSIS — Z3A29 29 weeks gestation of pregnancy: Secondary | ICD-10-CM

## 2020-05-22 DIAGNOSIS — O365930 Maternal care for other known or suspected poor fetal growth, third trimester, not applicable or unspecified: Secondary | ICD-10-CM

## 2020-05-22 DIAGNOSIS — Z362 Encounter for other antenatal screening follow-up: Secondary | ICD-10-CM

## 2020-05-25 NOTE — L&D Delivery Note (Addendum)
OB/GYN Faculty Practice Delivery Note  Karen Soto is a 30 y.o. G2P1001 s/p SVD at [redacted]w[redacted]d. She was admitted for IOL for IUGR.   ROM: 4h 59m with clear fluid GBS Status: Negative Maximum Maternal Temperature: 98.9  Labor Progress: . Presented for IOL for IUGR.  At time of presentation, cervix was unfavorable and began cervical ripening with a dose of Cytotec.  With next check, FB was placed and given a second dose of Cytotec.  FB subsequently came out, but only dilated to 1.5-2 cm.  FB was replaced and then came out and 3-4 cm.  Started on pitocin.  AROM with clear fluid.  Progressed to complete without complication.  Delivery Date/Time: 07/22/2020 at 0931 Delivery: Called to room and patient was complete and pushing. Head delivered LOA. No nuchal cord present. Shoulder and body delivered in usual fashion. Infant with spontaneous cry, placed on mother's abdomen, dried and stimulated. Cord clamped x 2 after 1-minute delay, and cut by FOB under my direct supervision. Cord blood drawn. Placenta delivered spontaneously with gentle cord traction. Fundus firm with massage and Pitocin. Labia, perineum, vagina, and cervix were inspected, found to have a left periurethral laceration.   Placenta: Intact, 3 vessel cord Complications: None Lacerations: Left periurethral that was hemostatic and did not require repair EBL: 300 cc Analgesia: Epidural  Infant: Viable female  APGARs 8 & 9  2605g   [name], MD 07/22/2020, 9:44 AM   I was gloved and present for entire delivery SVD without incident No difficulty with shoulders Lacerations/abrasions as listed above  Clayton Bibles, CNM 07/22/20 11:18 AM

## 2020-05-28 ENCOUNTER — Telehealth: Payer: Self-pay

## 2020-05-28 NOTE — Telephone Encounter (Signed)
Instructed patient to bed down at the knees and not put her head below her heart or do it too quickly as this may help with the problem. If it continues then she needs to let us know.  Patient is 30.2 and states her blood pressures have been normal. Baby moving well. Armandina Stammer RN

## 2020-05-28 NOTE — Telephone Encounter (Signed)
-----   Message from Marti Sleigh, [place] sent at 05/28/2020  9:57 AM EST ----- Regarding: Question Pt stated every time she bends over to pick something up, she see stars.   She wants a call back whenever you have time.  [phone]

## 2020-05-30 ENCOUNTER — Encounter: Payer: Medicaid Other | Admitting: Family Medicine

## 2020-06-07 ENCOUNTER — Encounter: Payer: Self-pay | Admitting: *Deleted

## 2020-06-07 ENCOUNTER — Ambulatory Visit: Payer: Medicaid Other | Admitting: *Deleted

## 2020-06-07 ENCOUNTER — Other Ambulatory Visit: Payer: Self-pay | Admitting: Obstetrics and Gynecology

## 2020-06-07 ENCOUNTER — Other Ambulatory Visit: Payer: Self-pay

## 2020-06-07 ENCOUNTER — Ambulatory Visit: Payer: Medicaid Other | Attending: Obstetrics and Gynecology

## 2020-06-07 DIAGNOSIS — O365930 Maternal care for other known or suspected poor fetal growth, third trimester, not applicable or unspecified: Secondary | ICD-10-CM

## 2020-06-07 DIAGNOSIS — Z148 Genetic carrier of other disease: Secondary | ICD-10-CM

## 2020-06-07 DIAGNOSIS — O358XX0 Maternal care for other (suspected) fetal abnormality and damage, not applicable or unspecified: Secondary | ICD-10-CM

## 2020-06-07 DIAGNOSIS — E669 Obesity, unspecified: Secondary | ICD-10-CM

## 2020-06-07 DIAGNOSIS — Z3480 Encounter for supervision of other normal pregnancy, unspecified trimester: Secondary | ICD-10-CM | POA: Diagnosis present

## 2020-06-07 DIAGNOSIS — O368130 Decreased fetal movements, third trimester, not applicable or unspecified: Secondary | ICD-10-CM

## 2020-06-07 DIAGNOSIS — O09293 Supervision of pregnancy with other poor reproductive or obstetric history, third trimester: Secondary | ICD-10-CM

## 2020-06-07 DIAGNOSIS — Z3A31 31 weeks gestation of pregnancy: Secondary | ICD-10-CM

## 2020-06-07 DIAGNOSIS — O99213 Obesity complicating pregnancy, third trimester: Secondary | ICD-10-CM

## 2020-06-07 DIAGNOSIS — Z362 Encounter for other antenatal screening follow-up: Secondary | ICD-10-CM

## 2020-06-11 ENCOUNTER — Encounter: Payer: Self-pay | Admitting: Obstetrics & Gynecology

## 2020-06-11 ENCOUNTER — Encounter: Payer: Medicaid Other | Admitting: Advanced Practice Midwife

## 2020-06-11 ENCOUNTER — Telehealth (INDEPENDENT_AMBULATORY_CARE_PROVIDER_SITE_OTHER): Payer: Medicaid Other | Admitting: Obstetrics & Gynecology

## 2020-06-11 ENCOUNTER — Other Ambulatory Visit: Payer: Self-pay | Admitting: Obstetrics & Gynecology

## 2020-06-11 DIAGNOSIS — O360130 Maternal care for anti-D [Rh] antibodies, third trimester, not applicable or unspecified: Secondary | ICD-10-CM

## 2020-06-11 DIAGNOSIS — Z6791 Unspecified blood type, Rh negative: Secondary | ICD-10-CM

## 2020-06-11 DIAGNOSIS — O365930 Maternal care for other known or suspected poor fetal growth, third trimester, not applicable or unspecified: Secondary | ICD-10-CM

## 2020-06-11 DIAGNOSIS — Z3480 Encounter for supervision of other normal pregnancy, unspecified trimester: Secondary | ICD-10-CM

## 2020-06-11 DIAGNOSIS — O365931 Maternal care for other known or suspected poor fetal growth, third trimester, fetus 1: Secondary | ICD-10-CM | POA: Insufficient documentation

## 2020-06-11 DIAGNOSIS — D509 Iron deficiency anemia, unspecified: Secondary | ICD-10-CM

## 2020-06-11 DIAGNOSIS — O09293 Supervision of pregnancy with other poor reproductive or obstetric history, third trimester: Secondary | ICD-10-CM

## 2020-06-11 DIAGNOSIS — O09299 Supervision of pregnancy with other poor reproductive or obstetric history, unspecified trimester: Secondary | ICD-10-CM

## 2020-06-11 DIAGNOSIS — E669 Obesity, unspecified: Secondary | ICD-10-CM

## 2020-06-11 DIAGNOSIS — Z3A32 32 weeks gestation of pregnancy: Secondary | ICD-10-CM

## 2020-06-11 DIAGNOSIS — O99213 Obesity complicating pregnancy, third trimester: Secondary | ICD-10-CM

## 2020-06-11 DIAGNOSIS — Z8632 Personal history of gestational diabetes: Secondary | ICD-10-CM

## 2020-06-11 DIAGNOSIS — O99210 Obesity complicating pregnancy, unspecified trimester: Secondary | ICD-10-CM

## 2020-06-11 DIAGNOSIS — O99013 Anemia complicating pregnancy, third trimester: Secondary | ICD-10-CM

## 2020-06-11 DIAGNOSIS — O26899 Other specified pregnancy related conditions, unspecified trimester: Secondary | ICD-10-CM

## 2020-06-11 MED ORDER — ASPIRIN 81 MG PO TBEC: 81.0000 mg | DELAYED_RELEASE_TABLET | Freq: Every day | ORAL | 12 refills | Status: DC

## 2020-06-11 NOTE — Progress Notes (Signed)
I connected with Karen Soto 06/11/20 at  1:00 PM EST by: MyChart video and verified that I am speaking with the correct person using two identifiers.  Patient is located at work and provider is located at [employer] office.     The purpose of this virtual visit is to provide medical care while limiting exposure to the novel coronavirus. I discussed the limitations, risks, security and privacy concerns of performing an evaluation and management service by MyChart video and the availability of in person appointments. I also discussed with the patient that there may be a patient responsible charge related to this service. By engaging in this virtual visit, you consent to the provision of healthcare.  Additionally, you authorize for your insurance to be billed for the services provided during this visit.  The patient expressed understanding and agreed to proceed.  The following staff members participated in the virtual visit: none    PRENATAL VISIT NOTE  Subjective:  Karen Soto is a 30 y.o. G2P1001 at [redacted]w[redacted]d  for phone visit for ongoing prenatal care.  She is currently monitored for the following issues for this high-risk pregnancy and has Obesity in pregnancy, antepartum; Rh negative state in antepartum period; Supervision of other normal pregnancy, antepartum; Alpha thalassemia trait; and Maternal iron deficiency anemia affecting pregnancy in third trimester, antepartum on their problem list.  Patient reports no complaints.  Denies leaking of fluid.   The following portions of the patient's history were reviewed and updated as appropriate: allergies, current medications, past family history, past medical history, past social history, past surgical history and problem list.   Objective:  There were no vitals filed for this visit. She does not have a BP cuff at home.  Fetal Status: good fetal movement, no contractions  Assessment and Plan:  Pregnancy: G2P1001 at [redacted]w[redacted]d 1. [redacted] weeks  gestation of pregnancy - continue PNV  2. IUGR (intrauterine growth restriction) affecting care of mother, third trimester, fetus 1 - BPP/NST scheduled tomorrow - Growth scan schedule 06/19/2020 - baby ASA recommended  3. Maternal iron deficiency anemia affecting pregnancy in third trimester, antepartum - not taking iron but discussed importance  4. Rh negative state in antepartum period - rhogam given 05/09/2020  5. Obesity in pregnancy, antepartum - growth scans as per above  6. History of gestational diabetes in prior pregnancy, currently pregnant - normal 2 hr GTT   Preterm labor symptoms and general obstetric precautions including but not limited to vaginal bleeding, contractions, leaking of fluid and fetal movement were reviewed in detail with the patient.  Return in about 2 weeks (around 06/25/2020) for virtual visit ok.  Future Appointments  Date Time Provider Department Center  06/12/2020  7:30 AM [facility] NURSE [facility]  06/12/2020  7:45 AM [facility] US5 [facility]  06/19/2020  7:15 AM [facility] NURSE [facility]  06/19/2020  7:30 AM [facility] US3 [facility]    Time spent on virtual visit: 15 minutes  Jerene Bears, MD

## 2020-06-12 ENCOUNTER — Ambulatory Visit: Payer: Medicaid Other | Attending: Obstetrics and Gynecology

## 2020-06-12 ENCOUNTER — Other Ambulatory Visit: Payer: Self-pay | Admitting: *Deleted

## 2020-06-12 ENCOUNTER — Ambulatory Visit: Payer: Medicaid Other | Admitting: *Deleted

## 2020-06-12 ENCOUNTER — Encounter: Payer: Self-pay | Admitting: *Deleted

## 2020-06-12 ENCOUNTER — Other Ambulatory Visit: Payer: Self-pay | Admitting: Obstetrics and Gynecology

## 2020-06-12 ENCOUNTER — Other Ambulatory Visit: Payer: Self-pay

## 2020-06-12 DIAGNOSIS — Z3480 Encounter for supervision of other normal pregnancy, unspecified trimester: Secondary | ICD-10-CM

## 2020-06-12 DIAGNOSIS — O358XX0 Maternal care for other (suspected) fetal abnormality and damage, not applicable or unspecified: Secondary | ICD-10-CM | POA: Diagnosis not present

## 2020-06-12 DIAGNOSIS — O365930 Maternal care for other known or suspected poor fetal growth, third trimester, not applicable or unspecified: Secondary | ICD-10-CM

## 2020-06-12 DIAGNOSIS — Z148 Genetic carrier of other disease: Secondary | ICD-10-CM

## 2020-06-12 DIAGNOSIS — O365990 Maternal care for other known or suspected poor fetal growth, unspecified trimester, not applicable or unspecified: Secondary | ICD-10-CM | POA: Diagnosis present

## 2020-06-12 DIAGNOSIS — Z362 Encounter for other antenatal screening follow-up: Secondary | ICD-10-CM

## 2020-06-12 DIAGNOSIS — O368130 Decreased fetal movements, third trimester, not applicable or unspecified: Secondary | ICD-10-CM | POA: Diagnosis not present

## 2020-06-12 DIAGNOSIS — O321XX0 Maternal care for breech presentation, not applicable or unspecified: Secondary | ICD-10-CM

## 2020-06-12 DIAGNOSIS — O09293 Supervision of pregnancy with other poor reproductive or obstetric history, third trimester: Secondary | ICD-10-CM

## 2020-06-12 DIAGNOSIS — Z3A32 32 weeks gestation of pregnancy: Secondary | ICD-10-CM

## 2020-06-12 NOTE — Procedures (Signed)
Karen Soto November 20, 1990 [redacted]w[redacted]d  Fetus A Non-Stress Test Interpretation for 06/12/20  Indication: Unsatisfactory BPP  Fetal Heart Rate A Mode: External Baseline Rate (A): 140 bpm Variability: Moderate Accelerations: 15 x 15 Decelerations: None Multiple birth?: No  Uterine Activity Mode: Palpation,Toco Contraction Frequency (min): none noted Resting Tone Palpated: Relaxed Resting Time: Adequate  Interpretation (Fetal Testing) Nonstress Test Interpretation: Reactive Overall Impression: Reassuring for gestational age Comments: Reviewed tracing with Dr. Judeth Cornfield

## 2020-06-19 ENCOUNTER — Ambulatory Visit: Payer: Medicaid Other | Admitting: *Deleted

## 2020-06-19 ENCOUNTER — Ambulatory Visit: Payer: Medicaid Other | Attending: Obstetrics and Gynecology

## 2020-06-19 ENCOUNTER — Other Ambulatory Visit: Payer: Self-pay

## 2020-06-19 ENCOUNTER — Encounter: Payer: Self-pay | Admitting: *Deleted

## 2020-06-19 DIAGNOSIS — O43193 Other malformation of placenta, third trimester: Secondary | ICD-10-CM

## 2020-06-19 DIAGNOSIS — O365930 Maternal care for other known or suspected poor fetal growth, third trimester, not applicable or unspecified: Secondary | ICD-10-CM | POA: Diagnosis not present

## 2020-06-19 DIAGNOSIS — O99213 Obesity complicating pregnancy, third trimester: Secondary | ICD-10-CM

## 2020-06-19 DIAGNOSIS — Z3A33 33 weeks gestation of pregnancy: Secondary | ICD-10-CM | POA: Diagnosis not present

## 2020-06-19 DIAGNOSIS — E669 Obesity, unspecified: Secondary | ICD-10-CM

## 2020-06-19 DIAGNOSIS — Z3480 Encounter for supervision of other normal pregnancy, unspecified trimester: Secondary | ICD-10-CM | POA: Insufficient documentation

## 2020-06-19 DIAGNOSIS — O09293 Supervision of pregnancy with other poor reproductive or obstetric history, third trimester: Secondary | ICD-10-CM | POA: Diagnosis not present

## 2020-06-19 DIAGNOSIS — Z148 Genetic carrier of other disease: Secondary | ICD-10-CM

## 2020-06-25 ENCOUNTER — Other Ambulatory Visit: Payer: Self-pay

## 2020-06-25 ENCOUNTER — Ambulatory Visit: Payer: Medicaid Other | Attending: Obstetrics and Gynecology

## 2020-06-25 ENCOUNTER — Ambulatory Visit: Payer: Medicaid Other | Admitting: *Deleted

## 2020-06-25 ENCOUNTER — Encounter: Payer: Self-pay | Admitting: *Deleted

## 2020-06-25 DIAGNOSIS — O365990 Maternal care for other known or suspected poor fetal growth, unspecified trimester, not applicable or unspecified: Secondary | ICD-10-CM | POA: Diagnosis present

## 2020-06-25 DIAGNOSIS — Z3480 Encounter for supervision of other normal pregnancy, unspecified trimester: Secondary | ICD-10-CM

## 2020-06-25 DIAGNOSIS — O99213 Obesity complicating pregnancy, third trimester: Secondary | ICD-10-CM | POA: Diagnosis not present

## 2020-06-25 DIAGNOSIS — Z148 Genetic carrier of other disease: Secondary | ICD-10-CM

## 2020-06-25 DIAGNOSIS — O09293 Supervision of pregnancy with other poor reproductive or obstetric history, third trimester: Secondary | ICD-10-CM

## 2020-06-25 DIAGNOSIS — O358XX0 Maternal care for other (suspected) fetal abnormality and damage, not applicable or unspecified: Secondary | ICD-10-CM

## 2020-06-25 DIAGNOSIS — Z3A34 34 weeks gestation of pregnancy: Secondary | ICD-10-CM

## 2020-06-25 DIAGNOSIS — O365930 Maternal care for other known or suspected poor fetal growth, third trimester, not applicable or unspecified: Secondary | ICD-10-CM | POA: Diagnosis not present

## 2020-06-25 DIAGNOSIS — E669 Obesity, unspecified: Secondary | ICD-10-CM

## 2020-06-25 DIAGNOSIS — O43193 Other malformation of placenta, third trimester: Secondary | ICD-10-CM

## 2020-06-26 ENCOUNTER — Telehealth (INDEPENDENT_AMBULATORY_CARE_PROVIDER_SITE_OTHER): Payer: Medicaid Other | Admitting: Family Medicine

## 2020-06-26 DIAGNOSIS — O365930 Maternal care for other known or suspected poor fetal growth, third trimester, not applicable or unspecified: Secondary | ICD-10-CM

## 2020-06-26 DIAGNOSIS — O365931 Maternal care for other known or suspected poor fetal growth, third trimester, fetus 1: Secondary | ICD-10-CM

## 2020-06-26 DIAGNOSIS — O26899 Other specified pregnancy related conditions, unspecified trimester: Secondary | ICD-10-CM

## 2020-06-26 DIAGNOSIS — Z6791 Unspecified blood type, Rh negative: Secondary | ICD-10-CM

## 2020-06-26 DIAGNOSIS — O99013 Anemia complicating pregnancy, third trimester: Secondary | ICD-10-CM

## 2020-06-26 DIAGNOSIS — O360130 Maternal care for anti-D [Rh] antibodies, third trimester, not applicable or unspecified: Secondary | ICD-10-CM

## 2020-06-26 DIAGNOSIS — Z3480 Encounter for supervision of other normal pregnancy, unspecified trimester: Secondary | ICD-10-CM

## 2020-06-26 DIAGNOSIS — Z3A34 34 weeks gestation of pregnancy: Secondary | ICD-10-CM

## 2020-06-26 DIAGNOSIS — D509 Iron deficiency anemia, unspecified: Secondary | ICD-10-CM

## 2020-06-26 NOTE — Progress Notes (Signed)
OBSTETRICS PRENATAL VIRTUAL VISIT ENCOUNTER NOTE  Provider location: Center for [facility]   I connected with Riley Lam on 06/26/20 at 11:15 AM EST by MyChart Video Encounter at home and verified that I am speaking with the correct person using two identifiers.   I discussed the limitations, risks, security and privacy concerns of performing an evaluation and management service virtually and the availability of in person appointments. I also discussed with the patient that there may be a patient responsible charge related to this service. The patient expressed understanding and agreed to proceed. Subjective:  Kilah Drahos is a 30 y.o. G2P1001 at [redacted]w[redacted]d being seen today for ongoing prenatal care.  She is currently monitored for the following issues for this high-risk pregnancy and has Obesity in pregnancy, antepartum; Rh negative state in antepartum period; Supervision of other normal pregnancy, antepartum; Alpha thalassemia trait; Maternal iron deficiency anemia affecting pregnancy in third trimester, antepartum; and IUGR (intrauterine growth restriction) affecting care of mother, third trimester, fetus 1 on their problem list.  Patient reports episodes of decreased fetal movement.  Contractions: Irritability. Vag. Bleeding: None.  Movement: Present. Denies any leaking of fluid.   The following portions of the patient's history were reviewed and updated as appropriate: allergies, current medications, past family history, past medical history, past social history, past surgical history and problem list.   Objective:  There were no vitals filed for this visit.  Fetal Status:     Movement: Present     General:  Alert, oriented and cooperative. Patient is in no acute distress.  Respiratory: Normal respiratory effort, no problems with respiration noted  Mental Status: Normal mood and affect. Normal behavior. Normal judgment and thought content.  Rest of  physical exam deferred due to type of encounter  Imaging: [place] MFM FETAL BPP W/NONSTRESS  Result Date: 06/12/2020 ----------------------------------------------------------------------  OBSTETRICS REPORT                    (Corrected Final 06/12/2020 11:41 am) ---------------------------------------------------------------------- Patient Info  ID #:       [id number]                          D.O.B.:  [birth year] (30 yrs)  Name:       HYWVPXTG Couvillon                  Visit Date: 06/12/2020 07:55 am ---------------------------------------------------------------------- Performed By  Attending:        Noralee Space MD        Ref. Address:     [address]                                                             [address]  Performed By:     Truitt Leep,      [facility]RD[facility]                                [facility]  Referred By:      [facility] [place] ---------------------------------------------------------------------- Orders  #  Description                           Code        Ordered By  1  US MFM OB FOLLOW UP                   [id number]    RAVI SHANKAR  2  US MFM UA CORD DOPPLER                [id number]    RAVI SHANKAR  3  US MFM FETAL BPP                      [id number]     RAVI [facility]     W/NONSTRESS ----------------------------------------------------------------------  #  Order #                     Accession #                Episode #  1  [id number]                   [id number]                 [id number]  2  [id number]                   [id number]                 [id number]  3  [id number]                   [id number]                 [id number] ---------------------------------------------------------------------- Indications  Maternal care for known or suspected poor      O36.5930  fetal growth, third trimester, not applicable  or  unspecified IUGR  Decreased fetal movements, third trimester,    O36.8130  unspecified  Echogenic intracardiac focus of the heart      O35.8XX0  (EIF)  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 Cord Insertion:      Marginal insertion  Amniotic Fluid  AFI FV:  Within normal limits  AFI Sum(cm)     %Tile       Largest Pocket(cm)  17.68           65          6.27  RUQ(cm)       RLQ(cm)       LUQ(cm)        LLQ(cm)  6.27          2.71          3.66           5.04 ---------------------------------------------------------------------- Biophysical Evaluation  Amniotic F.V:   Pocket => 2 cm             F. Tone:        Observed  F. Movement:    Observed                   N.S.T:          Reactive  F. Breathing:   Not Observed               Score:          8/10 ---------------------------------------------------------------------- Biometry  BPD:      76.7  mm     G. Age:  30w 5d          6  %    CI:         74.8   %    70 - 86                                                          FL/HC:      20.4   %    19.1 - 21.3  HC:      281.4  mm     G. Age:  30w 6d        1.1  %    HC/AC:      1.07        0.96 - 1.17  AC:      263.1  mm     G. Age:  30w 3d          6  %    FL/BPD:     74.7   %    71 - 87  FL:       57.3  mm     G. Age:  30w 0d        1.8  %    FL/AC:      21.8   %    20 - 24  LV:        4.3  mm  Est. FW:    1561  gm      3 lb 7 oz    3.2  % ----------------------------------------------------------------------  OB History  Gravidity:    2         Term:   1  Living:       1 ---------------------------------------------------------------------- Gestational Age  U/S Today:     30w 4d                                        EDD:   08/17/20  Best:  32w 3d     Det. By:  Previous Ultrasound      EDD:   08/04/20                                      (12/20/19) ---------------------------------------------------------------------- Anatomy  Cranium:               Appears normal         Heart:                  Appears normal                                                                        (4CH, axis, and                                                                        situs)  Cavum:                 Appears normal         RVOT:                   Appears normal  Ventricles:            Appears normal         LVOT:                   Appears normal  Choroid Plexus:        Appears normal         Aortic Arch:            Appears normal  Cerebellum:            Appears normal         Ductal Arch:            Appears normal  Posterior Fossa:       Appears normal         Stomach:                Appears normal, left                                                                        sided  Face:                  Appears normal         Kidneys:                Appear normal                         (orbits  and profile)  Lips:                  Appears normal         Bladder:                Appears normal  Other:  Other anatomy previously imaged and appeared normal. ---------------------------------------------------------------------- Doppler - Fetal Vessels  Umbilical Artery   S/D     %tile      RI    %tile      PI    %tile   2.29       27    0.56       27    0.78       25 ---------------------------------------------------------------------- Cervix Uterus Adnexa  Cervix  Length:           4.06  cm.  Normal appearance by transabdominal scan. ---------------------------------------------------------------------- Impression   Patient with fetal growth restriction returned for growth  assessment and antenatal testing. Marginal cord insertion  was seen on previous scans.  The estimated fetal weight is at the 3rd percentile. Head  circumference measurement is at between -2 and -1 SD  (normal). Amniotic fluid is normal and good fetal activity is  seen. Fetal breathing movements did not meet the criteria of  BPP. Umbilical artery Doppler showed normal forward  diastolic flow. NST is reactive. BPP 8/10.  Breech presentation.  I explained the finding of severe fetal growth restriction and  the importance of weekly antenatal testing.  BP at our office: 132/67 mm Hg . ---------------------------------------------------------------------- Recommendations  - Continue weekly BPP and UA Doppler studies till delivery  -We will make recommendations on the timing of delivery at  her next fetal growth assessment. ----------------------------------------------------------------------                       Noralee Space, MD Electronically Signed Corrected Final Report  06/12/2020 11:41 am ----------------------------------------------------------------------  [place] MFM FETAL BPP WO NON STRESS  Result Date: 06/25/2020 ----------------------------------------------------------------------  OBSTETRICS REPORT                       (Signed Final 06/25/2020 08:36 am) ---------------------------------------------------------------------- Patient Info  ID #:       [id number]                          D.O.B.:  1990/08/11 (30 yrs)  Name:       Annice Needy Stare                  Visit Date: 06/25/2020 07:35 am ---------------------------------------------------------------------- Performed By  Attending:        Noralee Space MD        Ref. Address:     [address]  Performed By:     Sandi Mealy        [facility]                    RD[facility]  [facility] at                                                             [facility]  Referred By:      [facility] [place] ---------------------------------------------------------------------- Orders  #  Description                           Code        Ordered By  1  [place] MFM FETAL BPP WO NON               76819.01    RAVI SHANKAR     STRESS  2  [place] MFM UA CORD DOPPLER                76820.02    RAVI [facility] ----------------------------------------------------------------------  #  Order #                     Accession #                Episode #  1  [id number]                   [id number]                 [id number]  2  [id number]                   [id number]                 [id number] ---------------------------------------------------------------------- Indications  Maternal care for known or suspected poor      O36.5930  fetal growth, third trimester, not applicable or  unspecified IUGR  Echogenic intracardiac focus of the heart      O35.8XX0  (EIF)  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, research (medical) Trait)             Z14.8  Poor obstetric history: Previous gestational   O09.299  diabetes  Obesity complicating pregnancy, third          O99.213  trimester (pregravid BMI 30)  Marginal insertion of umbilical cord affecting O43.193  management of mother in third trimester  [redacted] weeks gestation of pregnancy                Z3A.34 ---------------------------------------------------------------------- Fetal Evaluation  Num Of Fetuses:         1  Fetal Heart Rate(bpm):  132  Cardiac Activity:       Observed  Presentation:           Cephalic  Placenta:               Posterior Fundal  P. Cord Insertion:      Marginal insertion  Amniotic Fluid  AFI FV:      Within normal limits  AFI Sum(cm)     %Tile       Largest  Pocket(cm)  18.42           68          5.39  RUQ(cm)       RLQ(cm)       LUQ(cm)        LLQ(cm)  5.39          3.94          5.24           3.85  ---------------------------------------------------------------------- Biophysical Evaluation  Amniotic F.V:   Pocket => 2 cm             F. Tone:        Observed  F. Movement:    Observed                   Score:          8/8  F. Breathing:   Observed ---------------------------------------------------------------------- Biometry  LV:        2.9  mm ---------------------------------------------------------------------- OB History  Gravidity:    2         Term:   1  Living:       1 ---------------------------------------------------------------------- Gestational Age  Best:          34w 2d     Det. By:  Previous Ultrasound      EDD:   08/04/20                                      (12/20/19) ---------------------------------------------------------------------- Anatomy  Heart:                 Appears normal         Stomach:                Appears normal, left                         (4CH, axis, and                                sided                         situs)  LVOT:                  Appears normal         Bladder:                Appears normal  Diaphragm:             Appears normal ---------------------------------------------------------------------- Doppler - Fetal Vessels  Umbilical Artery   S/D     %tile      RI    %tile    2.4       42    0.58       46 ---------------------------------------------------------------------- Cervix Uterus Adnexa  Cervix  Length:           3.43  cm.  Normal appearance by transabdominal scan. ---------------------------------------------------------------------- Impression  Severe fetal growth restriction.  On ultrasound performed 2  weeks ago, the estimated fetal weight was at the 3rd  percentile.  Patient return for antenatal testing.  Amniotic fluid is normal and good fetal activity seen.Antenatal  testing is reassuring. BPP 8/8.  Umbilical artery Doppler  showed normal forward  diastolic flow.  We reassured the patient of the findings  ---------------------------------------------------------------------- Recommendations  -Continue weekly BPP and UA Doppler studies till delivery. ----------------------------------------------------------------------                  Noralee Space, MD Electronically Signed Final Report   06/25/2020 08:36 am ----------------------------------------------------------------------  [place] MFM FETAL BPP WO NON STRESS  Result Date: 06/19/2020 ----------------------------------------------------------------------  OBSTETRICS REPORT                       (Signed Final 06/19/2020 08:15 am) ---------------------------------------------------------------------- Patient Info  ID #:       [id number]                          D.O.B.:  1990-07-14 (30 yrs)  Name:       WUJWJXBJ Bouwman                  Visit Date: 06/19/2020 07:54 am ---------------------------------------------------------------------- Performed By  Attending:        Noralee Space MD        Ref. Address:     [address]  Performed By:     Emeline Darling BS,      [facility]                    RD[facility]                                     [facility] at                                                             [facility]  Referred By:      [facility] [place] ---------------------------------------------------------------------- Orders  #  Description                           Code        Ordered By  1  [place] MFM UA CORD DOPPLER                [id number]    [facility]  BOOKER  2  [place] MFM FETAL BPP WO NON               [id number]    RAVI [facility]     STRESS ----------------------------------------------------------------------  #  Order #                     Accession #                Episode #  1  [id number]                   [id number]                 [id number]  2  [id number]                    [id number]                 [id number] ---------------------------------------------------------------------- Indications  [redacted] weeks gestation of pregnancy                Z3A.33  Maternal care for known or suspected poor      O36.5930  fetal growth, third trimester, not applicable or  unspecified IUGR  Echogenic intracardiac focus of the heart      O35.8XX0  (EIF)  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, research (medical) Trait)             Z14.8  Poor obstetric history: Previous gestational   O09.299  diabetes  Obesity complicating pregnancy, third          O99.213  trimester (pregravid BMI 30)  Marginal insertion of umbilical cord affecting O43.193  management of mother in third trimester ---------------------------------------------------------------------- Fetal Evaluation  Num Of Fetuses:         1  Fetal Heart Rate(bpm):  131  Cardiac Activity:       Observed  Presentation:           Cephalic  Placenta:               Posterior Fundal  P. Cord Insertion:      Marginal insertion  Amniotic Fluid  AFI FV:      Within normal limits  AFI Sum(cm)     %Tile       Largest Pocket(cm)  15.4            55          4.4  RUQ(cm)       RLQ(cm)       LUQ(cm)        LLQ(cm)  2.6           4.3           4.1            4.4 ---------------------------------------------------------------------- Biophysical Evaluation  Amniotic F.V:   Pocket => 2 cm             F. Tone:        Observed  F. Movement:    Observed                   Score:          8/8  F. Breathing:   Observed ---------------------------------------------------------------------- OB History  Gravidity:    2         Term:   1  Living:       1 ---------------------------------------------------------------------- Gestational Age  Best:          33w 3d  Det. By:  Previous Ultrasound      EDD:   08/04/20                                      (12/20/19) ---------------------------------------------------------------------- Doppler - Fetal Vessels  Umbilical Artery   S/D     %tile      RI     %tile      PI    %tile            ADFV    RDFV   2.35       35    0.57       37    0.81       36               No      No ---------------------------------------------------------------------- Impression  Severe fetal growth restriction.  Patient returned for antenatal  testing.  Marginal cord insertion was seen on previous scans.  Patient does not have gestational diabetes.  Amniotic fluid is normal and good fetal activity is seen  .Antenatal testing is reassuring. BPP 8/8. Umbilical artery  Doppler showed normal forward diastolic flow .  Cephalic  presentation.  Blood pressure today at her office is 127/68 mmHg.  Obstetric history significant for a term vaginal delivery in 2017  of a female infant weighing 3,070 g (6-12) at birth.  I reassured the patient of the findings.  We will make recommendations for timing of delivery after  next fetal growth assessment in 2 weeks. ---------------------------------------------------------------------- Recommendations  -BPP and UA Doppler next week.  -Fetal growth assessment in 2 weeks. ----------------------------------------------------------------------                  Noralee Space, MD Electronically Signed Final Report   06/19/2020 08:15 am ----------------------------------------------------------------------  [place] MFM FETAL BPP WO NON STRESS  Result Date: 06/07/2020 ----------------------------------------------------------------------  OBSTETRICS REPORT                       (Signed Final 06/07/2020 10:47 am) ---------------------------------------------------------------------- Patient Info  ID #:       [id number]                          D.O.B.:  [birth year] (29 yrs)  Name:       WUJWJXBJ Bracknell                  Visit Date: 06/07/2020 07:42 am ---------------------------------------------------------------------- Performed By  Attending:        Noralee Space MD        Ref. Address:     [address]   Performed By:     Eden Lathe BS      [facility]         [facility]                    RDMS RVT  [facility] at                                                             [facility]  Referred By:      [facility] [place] ---------------------------------------------------------------------- Orders  #  Description                           Code        Ordered By  1  [place] MFM UA CORD DOPPLER                76820.02    RAVI SHANKAR  2  [place] MFM FETAL BPP WO NON               76819.01    RAVI [facility]     STRESS ----------------------------------------------------------------------  #  Order #                     Accession #                Episode #  1  [id number]                   [id number]                 [id number]  2  [id number]                   [id number]                 [id number] ---------------------------------------------------------------------- Indications  Maternal care for known or suspected poor      O36.5930  fetal growth, third trimester, not applicable or  unspecified IUGR  Decreased fetal movements, third trimester,    O36.8130  unspecified  Echogenic intracardiac focus of the heart      O35.8XX0  (EIF)  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, research (medical) Trait)             Z14.8  Poor obstetric history: Previous gestational   O09.299  diabetes  Obesity complicating pregnancy, third          O99.213  trimester (pregravid BMI 30)  Encounter for other antenatal screening        Z36.2  follow-up  [redacted] weeks gestation of pregnancy                Z3A.31 ---------------------------------------------------------------------- Fetal Evaluation  Num Of Fetuses:         1  Fetal Heart Rate(bpm):  144  Cardiac Activity:       Observed  Presentation:           Cephalic  Amniotic Fluid  AFI FV:      Within normal limits  AFI Sum(cm)     %Tile       Largest Pocket(cm)  16.1            58  5.4  RUQ(cm)       RLQ(cm)       LUQ(cm)         LLQ(cm)  3             2.6           5.4            5.1 ---------------------------------------------------------------------- Biophysical Evaluation  Amniotic F.V:   Within normal limits       F. Tone:        Observed  F. Movement:    Observed                   Score:          8/8  F. Breathing:   Observed ---------------------------------------------------------------------- Biometry  LV:        1.2  mm ---------------------------------------------------------------------- OB History  Gravidity:    2         Term:   1  Living:       1 ---------------------------------------------------------------------- Gestational Age  Best:          31w 5d     Det. By:  Previous Ultrasound      EDD:   08/04/20                                      (12/20/19) ---------------------------------------------------------------------- Doppler - Fetal Vessels  Umbilical Artery   S/D     %tile                                              ADFV    RDFV      3       67                                                 No      No ---------------------------------------------------------------------- Cervix Uterus Adnexa  Cervix  Not visualized (advanced GA >24wks)  Uterus  No abnormality visualized.  Cul De Sac  No free fluid seen.  Adnexa  No abnormality visualized. ---------------------------------------------------------------------- Impression  Fetal growth restriction.  Patient return for antenatal testing.  Patient does not have gestational diabetes.  Blood pressure  today at her office is 119/63 mmHg.  Amniotic fluid is normal and good fetal activity is seen  .Antenatal testing is reassuring. BPP 8/8.  Umbilical artery  Doppler showed normal forward diastolic flow.  We reassured the patient of the findings. ---------------------------------------------------------------------- Recommendations  Fetal growth assessment next week  Continue weekly BPP and UA Doppler studies till delivery.  ----------------------------------------------------------------------                  Noralee Space, MD Electronically Signed Final Report   06/07/2020 10:47 am ----------------------------------------------------------------------  [place] MFM OB FOLLOW UP  Result Date: 06/12/2020 ----------------------------------------------------------------------  OBSTETRICS REPORT                    (Corrected Final 06/12/2020 11:41 am) ---------------------------------------------------------------------- Patient Info  ID #:       [id number]  D.O.B.:  1991-01-04 (29 yrs)  Name:       ZENITH Montalto                  Visit Date: 06/12/2020 07:55 am ---------------------------------------------------------------------- Performed By  Attending:        Noralee Space MD        Ref. Address:     [address]  Performed By:     Truitt Leep,      [facility]                                [facility]  Referred By:      [facility] [place] ---------------------------------------------------------------------- Orders  #  Description                           Code        Ordered By  1  [place] MFM OB FOLLOW UP                   [id number]    RAVI SHANKAR  2  [place] MFM UA CORD DOPPLER                76820.02    RAVI SHANKAR  3  [place] MFM FETAL BPP                      16109.6     RAVI [facility]     W/NONSTRESS ----------------------------------------------------------------------  #  Order #                     Accession #                Episode #  1  [id number]                   [id number]                 [id number]  2  [id number]                   [id number]  [id number]  3  [id number]                   [id number]                 [id number]  ---------------------------------------------------------------------- Indications  Maternal care for known or suspected poor      O36.5930  fetal growth, third trimester, not applicable or  unspecified IUGR  Decreased fetal movements, third trimester,    O36.8130  unspecified  Echogenic intracardiac focus of the heart      O35.8XX0  (EIF)  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, research (medical) Trait)             Z14.8  Poor obstetric history: Previous gestational   O09.299  diabetes  Obesity complicating pregnancy, third          O99.213  trimester (pregravid BMI 30)  Encounter for other antenatal screening        Z36.2  follow-up  [redacted] weeks gestation of pregnancy                Z3A.32 ---------------------------------------------------------------------- Fetal Evaluation  Num Of Fetuses:         1  Cardiac Activity:       Observed  Presentation:           Breech  Placenta:               Posterior Fundal  P. Cord Insertion:      Marginal insertion  Amniotic Fluid  AFI FV:      Within normal limits  AFI Sum(cm)     %Tile       Largest Pocket(cm)  17.68           65          6.27  RUQ(cm)       RLQ(cm)       LUQ(cm)        LLQ(cm)  6.27          2.71          3.66           5.04 ---------------------------------------------------------------------- Biophysical Evaluation  Amniotic F.V:   Pocket => 2 cm             F. Tone:        Observed  F. Movement:    Observed                   N.S.T:          Reactive  F. Breathing:   Not Observed               Score:          8/10 ---------------------------------------------------------------------- Biometry  BPD:      76.7  mm     G. Age:  30w 5d          6  %    CI:         74.8   %    70 - 86                                                          FL/HC:      20.4   %    19.1 - 21.3  HC:      281.4  mm     G. Age:  30w 6d        1.1  %    HC/AC:      1.07        0.96 - 1.17  AC:      263.1  mm     G. Age:  30w 3d          6  %    FL/BPD:     74.7   %    71 - 87  FL:       57.3  mm     G. Age:  30w 0d         1.8  %    FL/AC:      21.8   %    20 - 24  LV:        4.3  mm  Est. FW:    1561  gm      3 lb 7 oz    3.2  % ---------------------------------------------------------------------- OB History  Gravidity:    2         Term:   1  Living:       1 ---------------------------------------------------------------------- Gestational Age  U/S Today:     30w 4d                                        EDD:   08/17/20  Best:          Armida Sans 3d     Det. By:  Previous Ultrasound      EDD:   08/04/20                                      (12/20/19) ---------------------------------------------------------------------- Anatomy  Cranium:               Appears normal         Heart:                  Appears normal                                                                        (4CH, axis, and                                                                        situs)  Cavum:                 Appears normal         RVOT:                   Appears normal  Ventricles:            Appears normal         LVOT:  Appears normal  Choroid Plexus:        Appears normal         Aortic Arch:            Appears normal  Cerebellum:            Appears normal         Ductal Arch:            Appears normal  Posterior Fossa:       Appears normal         Stomach:                Appears normal, left                                                                        sided  Face:                  Appears normal         Kidneys:                Appear normal                         (orbits and profile)  Lips:                  Appears normal         Bladder:                Appears normal  Other:  Other anatomy previously imaged and appeared normal. ---------------------------------------------------------------------- Doppler - Fetal Vessels  Umbilical Artery   S/D     %tile      RI    %tile      PI    %tile   2.29       27    0.56       27    0.78       25 ----------------------------------------------------------------------  Cervix Uterus Adnexa  Cervix  Length:           4.06  cm.  Normal appearance by transabdominal scan. ---------------------------------------------------------------------- Impression  Patient with fetal growth restriction returned for growth  assessment and antenatal testing. Marginal cord insertion  was seen on previous scans.  The estimated fetal weight is at the 3rd percentile. Head  circumference measurement is at between -2 and -1 SD  (normal). Amniotic fluid is normal and good fetal activity is  seen. Fetal breathing movements did not meet the criteria of  BPP. Umbilical artery Doppler showed normal forward  diastolic flow. NST is reactive. BPP 8/10.  Breech presentation.  I explained the finding of severe fetal growth restriction and  the importance of weekly antenatal testing.  BP at our office: 132/67 mm Hg . ---------------------------------------------------------------------- Recommendations  - Continue weekly BPP and UA Doppler studies till delivery  -We will make recommendations on the timing of delivery at  her next fetal growth assessment. ----------------------------------------------------------------------                       Noralee Space, MD Electronically Signed Corrected Final Report  06/12/2020 11:41 am ----------------------------------------------------------------------  [place] MFM UA CORD DOPPLER  Result  Date: 06/25/2020 ----------------------------------------------------------------------  OBSTETRICS REPORT                       (Signed Final 06/25/2020 08:36 am) ---------------------------------------------------------------------- Patient Info  ID #:       [id number]                          D.O.B.:  [birth year] (30 yrs)  Name:       Annice Needy Polus                  Visit Date: 06/25/2020 07:35 am ---------------------------------------------------------------------- Performed By  Attending:        Noralee Space MD        Ref. Address:     [address]  Performed By:     Sandi Mealy        Location[facility]                    RD[facility]                                     [facility]  Referred By:      [facility] [place] ---------------------------------------------------------------------- Orders  #  Description                           Code        Ordered By  1  [place] MFM FETAL BPP WO NON               76819.01    RAVI SHANKAR     STRESS  2  [place] MFM UA CORD DOPPLER                40981.19    RAVI [facility] ----------------------------------------------------------------------  #  Order #                     Accession #                Episode #  1  [id number]                   [id number]  [id number]  2  [id number]                   [id number]                 [id number] ---------------------------------------------------------------------- Indications  Maternal care for known or suspected poor      O36.5930  fetal growth, third trimester, not applicable or  unspecified IUGR  Echogenic intracardiac focus of the heart      O35.8XX0  (EIF)  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, research (medical) Trait)             Z14.8  Poor obstetric history: Previous gestational   O09.299  diabetes  Obesity complicating pregnancy, third          O99.213  trimester (pregravid BMI 30)  Marginal insertion of umbilical cord affecting O43.193  management of mother in third trimester  [redacted] weeks gestation of pregnancy                Z3A.34 ---------------------------------------------------------------------- Fetal Evaluation  Num Of Fetuses:         1  Fetal Heart Rate(bpm):  132  Cardiac Activity:       Observed  Presentation:           Cephalic  Placenta:               Posterior Fundal  P. Cord Insertion:      Marginal insertion  Amniotic Fluid  AFI FV:      Within normal limits  AFI Sum(cm)     %Tile       Largest  Pocket(cm)  18.42           68          5.39  RUQ(cm)       RLQ(cm)       LUQ(cm)        LLQ(cm)  5.39          3.94          5.24           3.85 ---------------------------------------------------------------------- Biophysical Evaluation  Amniotic F.V:   Pocket => 2 cm             F. Tone:        Observed  F. Movement:    Observed                   Score:          8/8  F. Breathing:   Observed ---------------------------------------------------------------------- Biometry  LV:        2.9  mm ---------------------------------------------------------------------- OB History  Gravidity:    2         Term:   1  Living:       1 ---------------------------------------------------------------------- Gestational Age  Best:          34w 2d     Det. By:  Previous Ultrasound      EDD:   08/04/20                                      (12/20/19) ---------------------------------------------------------------------- Anatomy  Heart:                 Appears normal         Stomach:                Appears normal, left                         (  4CH, axis, and                                sided                         situs)  LVOT:                  Appears normal         Bladder:                Appears normal  Diaphragm:             Appears normal ---------------------------------------------------------------------- Doppler - Fetal Vessels  Umbilical Artery   S/D     %tile      RI    %tile    2.4       42    0.58       46 ---------------------------------------------------------------------- Cervix Uterus Adnexa  Cervix  Length:           3.43  cm.  Normal appearance by transabdominal scan. ---------------------------------------------------------------------- Impression  Severe fetal growth restriction.  On ultrasound performed 2  weeks ago, the estimated fetal weight was at the 3rd  percentile.  Patient return for antenatal testing.  Amniotic fluid is normal and good fetal activity seen.Antenatal  testing is reassuring. BPP 8/8.   Umbilical artery Doppler  showed normal forward diastolic flow.  We reassured the patient of the findings ---------------------------------------------------------------------- Recommendations  -Continue weekly BPP and UA Doppler studies till delivery. ----------------------------------------------------------------------                  Noralee Space, MD Electronically Signed Final Report   06/25/2020 08:36 am ----------------------------------------------------------------------  [place] MFM UA CORD DOPPLER  Result Date: 06/19/2020 ----------------------------------------------------------------------  OBSTETRICS REPORT                       (Signed Final 06/19/2020 08:15 am) ---------------------------------------------------------------------- Patient Info  ID #:       [id number]                          D.O.B.:  15-Mar-1991 (30 yrs)  Name:       WUJWJXBJ Virgil                  Visit Date: 06/19/2020 07:54 am ---------------------------------------------------------------------- Performed By  Attending:        Noralee Space MD        Ref. Address:     [address]  Performed By:     Emeline Darling BS,      Location[facility]                                     [facility]  Referred By:      [facility] [place] ---------------------------------------------------------------------- Orders  #  Description                           Code        Ordered By  1  [place] MFM UA CORD DOPPLER                76820.02    Lin Landsman  2  [place] MFM FETAL BPP WO NON               76819.01    RAVI [facility]     STRESS ----------------------------------------------------------------------  #  Order #                     Accession #                 Episode #  1  [id number]                   [id number]                 [id number]  2  [id number]                   [id number]                 [id number] ---------------------------------------------------------------------- Indications  [redacted] weeks gestation of pregnancy                Z3A.33  Maternal care for known or suspected poor      O36.5930  fetal growth, third trimester, not applicable or  unspecified IUGR  Echogenic intracardiac focus of the heart      O35.8XX0  (EIF)  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 Cord Insertion:      Marginal insertion  Amniotic Fluid  AFI FV:      Within normal limits  AFI Sum(cm)     %Tile       Largest Pocket(cm)  15.4            55          4.4  RUQ(cm)       RLQ(cm)       LUQ(cm)        LLQ(cm)  2.6           4.3           4.1            4.4 ---------------------------------------------------------------------- Biophysical Evaluation  Amniotic F.V:   Pocket => 2 cm             F. Tone:        Observed  F. Movement:    Observed                   Score:          8/8  F. Breathing:   Observed ---------------------------------------------------------------------- OB History  Gravidity:    2         Term:   1  Living:       1 ---------------------------------------------------------------------- Gestational Age  Best:          33w 3d     Det. By:  Previous Ultrasound      EDD:   08/04/20                                      (12/20/19)  ---------------------------------------------------------------------- Doppler - Fetal Vessels  Umbilical Artery   S/D     %tile      RI    %tile      PI    %tile            ADFV    RDFV   2.35       35    0.57       37    0.81       36               No      No ---------------------------------------------------------------------- Impression  Severe fetal growth restriction.  Patient returned for antenatal  testing.  Marginal cord insertion was seen on previous scans.  Patient does not have gestational diabetes.  Amniotic fluid is normal and good fetal activity is seen  .Antenatal testing is reassuring. BPP 8/8. Umbilical artery  Doppler showed normal forward diastolic flow .  Cephalic  presentation.  Blood pressure today at her office is 127/68 mmHg.  Obstetric history significant for a term vaginal delivery in 2017  of a female infant weighing 3,070 g (6-12) at birth.  I reassured the patient of the findings.  We will make recommendations for timing of delivery after  next fetal growth assessment in 2 weeks. ---------------------------------------------------------------------- Recommendations  -BPP and UA Doppler next week.  -Fetal growth assessment in 2 weeks. ----------------------------------------------------------------------                  Noralee Space, MD Electronically Signed Final Report   06/19/2020 08:15 am ----------------------------------------------------------------------  [place] MFM UA CORD DOPPLER  Result Date: 06/12/2020 ----------------------------------------------------------------------  OBSTETRICS REPORT                    (  Corrected Final 06/12/2020 11:41 am) ---------------------------------------------------------------------- Patient Info  ID #:       [id number]                          D.O.B.:  06/27/1990 (29 yrs)  Name:       ZDGUYQIH Vallery                  Visit Date: 06/12/2020 07:55 am ---------------------------------------------------------------------- Performed By  Attending:         Noralee Space MD        Ref. Address:     [address]  Performed By:     Truitt Leep,      Location[facility]                                [facility]  Referred By:      [facility] [place] ---------------------------------------------------------------------- Orders  #  Description                           Code        Ordered By  1  [place] MFM OB FOLLOW UP                   [id number]    RAVI [facility]  2  [place] MFM UA CORD DOPPLER                76820.02    RAVI SHANKAR  3  [place] MFM FETAL BPP                      47425.9     RAVI [facility]     W/NONSTRESS ----------------------------------------------------------------------  #  Order #                     Accession #                Episode #  1  [id number]                   [id number]  [id number]  2  [id number]                   [id number]                 [id number]  3  [id number]                   [id number]                 [id number] ---------------------------------------------------------------------- Indications  Maternal care for known or suspected poor      O36.5930  fetal growth, third trimester, not applicable or  unspecified IUGR  Decreased fetal movements, third trimester,    O36.8130  unspecified  Echogenic intracardiac focus of the heart      O35.8XX0  (EIF)  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 Cord Insertion:      Marginal insertion  Amniotic Fluid  AFI FV:      Within normal limits  AFI Sum(cm)     %Tile       Largest Pocket(cm)  17.68           65          6.27  RUQ(cm)       RLQ(cm)       LUQ(cm)        LLQ(cm)  6.27          2.71          3.66           5.04 ---------------------------------------------------------------------- Biophysical Evaluation  Amniotic F.V:   Pocket => 2 cm             F. Tone:        Observed  F. Movement:    Observed                   N.S.T:          Reactive  F. Breathing:   Not Observed               Score:          8/10 ---------------------------------------------------------------------- Biometry  BPD:      76.7  mm     G. Age:  30w 5d          6  %    CI:         74.8   %    70 - 86  FL/HC:      20.4   %    19.1 - 21.3  HC:      281.4  mm     G. Age:  30w 6d        1.1  %    HC/AC:      1.07        0.96 - 1.17  AC:      263.1  mm     G. Age:  30w 3d          6  %    FL/BPD:     74.7   %    71 - 87  FL:       57.3  mm     G. Age:  30w 0d        1.8  %    FL/AC:      21.8   %    20 - 24  LV:        4.3  mm  Est. FW:    1561  gm      3 lb 7 oz    3.2  % ---------------------------------------------------------------------- OB History  Gravidity:    2         Term:   1  Living:       1 ---------------------------------------------------------------------- Gestational Age  U/S Today:     30w 4d                                        EDD:   08/17/20  Best:          32w 3d     Det. By:  Previous Ultrasound      EDD:   08/04/20                                      (12/20/19) ---------------------------------------------------------------------- Anatomy  Cranium:               Appears normal         Heart:                  Appears normal                                                                        (4CH,  axis, and                                                                        situs)  Cavum:                 Appears normal         RVOT:                   Appears normal  Ventricles:  Appears normal         LVOT:                   Appears normal  Choroid Plexus:        Appears normal         Aortic Arch:            Appears normal  Cerebellum:            Appears normal         Ductal Arch:            Appears normal  Posterior Fossa:       Appears normal         Stomach:                Appears normal, left                                                                        sided  Face:                  Appears normal         Kidneys:                Appear normal                         (orbits and profile)  Lips:                  Appears normal         Bladder:                Appears normal  Other:  Other anatomy previously imaged and appeared normal. ---------------------------------------------------------------------- Doppler - Fetal Vessels  Umbilical Artery   S/D     %tile      RI    %tile      PI    %tile   2.29       27    0.56       27    0.78       25 ---------------------------------------------------------------------- Cervix Uterus Adnexa  Cervix  Length:           4.06  cm.  Normal appearance by transabdominal scan. ---------------------------------------------------------------------- Impression  Patient with fetal growth restriction returned for growth  assessment and antenatal testing. Marginal cord insertion  was seen on previous scans.  The estimated fetal weight is at the 3rd percentile. Head  circumference measurement is at between -2 and -1 SD  (normal). Amniotic fluid is normal and good fetal activity is  seen. Fetal breathing movements did not meet the criteria of  BPP. Umbilical artery Doppler showed normal forward  diastolic flow. NST is reactive. BPP 8/10.  Breech presentation.  I explained the finding of severe fetal growth restriction and  the importance of weekly antenatal  testing.  BP at our office: 132/67 mm Hg . ---------------------------------------------------------------------- Recommendations  - Continue weekly BPP and UA Doppler studies till delivery  -We will make recommendations on the timing of delivery at  her next fetal growth assessment. ----------------------------------------------------------------------  Noralee Space, MD Electronically Signed Corrected Final Report  06/12/2020 11:41 am ----------------------------------------------------------------------  [place] MFM UA CORD DOPPLER  Result Date: 06/07/2020 ----------------------------------------------------------------------  OBSTETRICS REPORT                       (Signed Final 06/07/2020 10:47 am) ---------------------------------------------------------------------- Patient Info  ID #:       [id number]                          D.O.B.:  1990-08-26 (29 yrs)  Name:       [id number] Fogal                  Visit Date: 06/07/2020 07:42 am ---------------------------------------------------------------------- Performed By  Attending:        Noralee Space MD        Ref. Address:     [address]  Performed By:     Eden Lathe BS      [facility]                    RDMS RVT                                 [facility]  Referred By:      [facility] ---------------------------------------------------------------------- Orders  #  Description                           Code        Ordered By  1  [place] MFM UA CORD DOPPLER                76820.02    RAVI SHANKAR  2  [place] MFM FETAL BPP WO NON               47829.56    RAVI [facility]     STRESS ----------------------------------------------------------------------  #  Order #                     Accession #                Episode #   1  [id number]  [id number]                 [id number]  2  [id number]                   [id number]                 [id number] ---------------------------------------------------------------------- Indications  Maternal care for known or suspected poor      O36.5930  fetal growth, third trimester, not applicable or  unspecified IUGR  Decreased fetal movements, third trimester,    O36.8130  unspecified  Echogenic intracardiac focus of the heart      O35.8XX0  (EIF)  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, research (medical) Trait)             Z14.8  Poor obstetric history: Previous gestational   O09.299  diabetes  Obesity complicating pregnancy, third          O99.213  trimester (pregravid BMI 30)  Encounter for other antenatal screening        Z36.2  follow-up  [redacted] weeks gestation of pregnancy                Z3A.31 ---------------------------------------------------------------------- Fetal Evaluation  Num Of Fetuses:         1  Fetal Heart Rate(bpm):  144  Cardiac Activity:       Observed  Presentation:           Cephalic  Amniotic Fluid  AFI FV:      Within normal limits  AFI Sum(cm)     %Tile       Largest Pocket(cm)  16.1            58          5.4  RUQ(cm)       RLQ(cm)       LUQ(cm)        LLQ(cm)  3             2.6           5.4            5.1 ---------------------------------------------------------------------- Biophysical Evaluation  Amniotic F.V:   Within normal limits       F. Tone:        Observed  F. Movement:    Observed                   Score:          8/8  F. Breathing:   Observed ---------------------------------------------------------------------- Biometry  LV:        1.2  mm ---------------------------------------------------------------------- OB History  Gravidity:    2         Term:   1  Living:       1 ---------------------------------------------------------------------- Gestational Age  Best:          31w 5d     Det. By:  Previous Ultrasound      EDD:   08/04/20                                      (12/20/19)  ---------------------------------------------------------------------- Doppler - Fetal Vessels  Umbilical Artery   S/D     %tile  ADFV    RDFV      3       67                                                 No      No ---------------------------------------------------------------------- Cervix Uterus Adnexa  Cervix  Not visualized (advanced GA >24wks)  Uterus  No abnormality visualized.  Cul De Sac  No free fluid seen.  Adnexa  No abnormality visualized. ---------------------------------------------------------------------- Impression  Fetal growth restriction.  Patient return for antenatal testing.  Patient does not have gestational diabetes.  Blood pressure  today at her office is 119/63 mmHg.  Amniotic fluid is normal and good fetal activity is seen  .Antenatal testing is reassuring. BPP 8/8.  Umbilical artery  Doppler showed normal forward diastolic flow.  We reassured the patient of the findings. ---------------------------------------------------------------------- Recommendations  Fetal growth assessment next week  Continue weekly BPP and UA Doppler studies till delivery. ----------------------------------------------------------------------                  Noralee Space, MD Electronically Signed Final Report   06/07/2020 10:47 am ----------------------------------------------------------------------   Assessment and Plan:  Pregnancy: G2P1001 at [redacted]w[redacted]d 1. Supervision of other normal pregnancy, antepartum Good fetal movement today. Discussed if goes a few hours without feeling baby move, then should go to hospital for evaluation.  2. Rh negative state in antepartum period  3. IUGR (intrauterine growth restriction) affecting care of mother, third trimester, fetus 1 [place] next week BPP and dopplers from yesterday reviewed - normal. Probable delivery between 37-38 weeks.  GBS next appt.  4. Maternal iron deficiency anemia affecting pregnancy in third  trimester, antepartum Iron every other day. Recheck CBC next appt.  Preterm labor symptoms and general obstetric precautions including but not limited to vaginal bleeding, contractions, leaking of fluid and fetal movement were reviewed in detail with the patient. I discussed the assessment and treatment plan with the patient. The patient was provided an opportunity to ask questions and all were answered. The patient agreed with the plan and demonstrated an understanding of the instructions. The patient was advised to call back or seek an in-person office evaluation/go [facility] for any urgent or concerning symptoms. Please refer to After Visit Summary for other counseling recommendations.   I provided 11 minutes of face-to-face time during this encounter.  Return in about 1 week (around 07/03/2020) for OB f/u, GBS, In Office.  Future Appointments  Date Time Provider Department Center  07/02/2020  7:30 AM [facility] NURSE [facility]  07/02/2020  7:45 AM [facility] US4 [facility]  07/09/2020  7:15 AM [facility] NURSE [facility]  07/09/2020  7:30 AM [facility] US3 [facility]    Levie Heritage, DO [facility] [employer]

## 2020-07-02 ENCOUNTER — Encounter: Payer: Self-pay | Admitting: *Deleted

## 2020-07-02 ENCOUNTER — Other Ambulatory Visit: Payer: Self-pay | Admitting: Obstetrics and Gynecology

## 2020-07-02 ENCOUNTER — Other Ambulatory Visit: Payer: Self-pay | Admitting: *Deleted

## 2020-07-02 ENCOUNTER — Ambulatory Visit: Payer: Medicaid Other | Attending: Obstetrics and Gynecology

## 2020-07-02 ENCOUNTER — Other Ambulatory Visit: Payer: Self-pay

## 2020-07-02 ENCOUNTER — Ambulatory Visit: Payer: Medicaid Other | Admitting: *Deleted

## 2020-07-02 DIAGNOSIS — Z3480 Encounter for supervision of other normal pregnancy, unspecified trimester: Secondary | ICD-10-CM | POA: Insufficient documentation

## 2020-07-02 DIAGNOSIS — O365990 Maternal care for other known or suspected poor fetal growth, unspecified trimester, not applicable or unspecified: Secondary | ICD-10-CM | POA: Insufficient documentation

## 2020-07-02 DIAGNOSIS — O365930 Maternal care for other known or suspected poor fetal growth, third trimester, not applicable or unspecified: Secondary | ICD-10-CM | POA: Diagnosis not present

## 2020-07-02 DIAGNOSIS — O09293 Supervision of pregnancy with other poor reproductive or obstetric history, third trimester: Secondary | ICD-10-CM

## 2020-07-02 DIAGNOSIS — O358XX0 Maternal care for other (suspected) fetal abnormality and damage, not applicable or unspecified: Secondary | ICD-10-CM

## 2020-07-02 DIAGNOSIS — O43193 Other malformation of placenta, third trimester: Secondary | ICD-10-CM

## 2020-07-02 DIAGNOSIS — O99213 Obesity complicating pregnancy, third trimester: Secondary | ICD-10-CM

## 2020-07-02 DIAGNOSIS — Z148 Genetic carrier of other disease: Secondary | ICD-10-CM

## 2020-07-02 DIAGNOSIS — Z3A35 35 weeks gestation of pregnancy: Secondary | ICD-10-CM

## 2020-07-02 NOTE — Procedures (Signed)
Karen Soto 07/18/1990 [redacted]w[redacted]d  Fetus A Non-Stress Test Interpretation for 07/02/20  Indication: IUGR and Unsatisfactory BPP  Fetal Heart Rate A Mode: External Baseline Rate (A): 140 bpm Variability: Moderate Accelerations: 15 x 15 Decelerations: None Multiple birth?: No  Uterine Activity Mode: Palpation,Toco Contraction Frequency (min): None Resting Tone Palpated: Relaxed Resting Time: Adequate  Interpretation (Fetal Testing) Nonstress Test Interpretation: Reactive Comments: Dr. Judeth Cornfield reviewed tracing.

## 2020-07-03 ENCOUNTER — Encounter: Payer: Self-pay | Admitting: Obstetrics & Gynecology

## 2020-07-03 ENCOUNTER — Ambulatory Visit (INDEPENDENT_AMBULATORY_CARE_PROVIDER_SITE_OTHER): Payer: Medicaid Other | Admitting: Obstetrics & Gynecology

## 2020-07-03 ENCOUNTER — Other Ambulatory Visit: Payer: Self-pay

## 2020-07-03 ENCOUNTER — Other Ambulatory Visit (HOSPITAL_COMMUNITY)
Admission: RE | Admit: 2020-07-03 | Discharge: 2020-07-03 | Disposition: A | Discharge status: Home / Self Care | Payer: Medicaid Other | Source: Ambulatory Visit | Attending: Obstetrics & Gynecology | Admitting: Obstetrics & Gynecology

## 2020-07-03 VITALS — BP 118/75 | HR 102 | Wt 252.0 lb

## 2020-07-03 DIAGNOSIS — O365931 Maternal care for other known or suspected poor fetal growth, third trimester, fetus 1: Secondary | ICD-10-CM | POA: Insufficient documentation

## 2020-07-03 DIAGNOSIS — O0993 Supervision of high risk pregnancy, unspecified, third trimester: Secondary | ICD-10-CM

## 2020-07-03 DIAGNOSIS — Z3A35 35 weeks gestation of pregnancy: Secondary | ICD-10-CM

## 2020-07-03 NOTE — Progress Notes (Signed)
PRENATAL VISIT NOTE  Subjective:  Karen Soto is a 30 y.o. G2P1001 at [redacted]w[redacted]d being seen today for ongoing prenatal care.  She is currently monitored for the following issues for this high-risk pregnancy and has Obesity in pregnancy, antepartum; Rh negative state in antepartum period; Supervision of high risk pregnancy, antepartum, third trimester; Alpha thalassemia trait; Maternal iron deficiency anemia affecting pregnancy in third trimester, antepartum; and IUGR (intrauterine growth restriction) affecting care of mother, third trimester, fetus 1 on their problem list.  Patient reports no complaints.  Contractions: Irregular. Vag. Bleeding: None.  Movement: Present. Denies leaking of fluid.   The following portions of the patient's history were reviewed and updated as appropriate: allergies, current medications, past family history, past medical history, past social history, past surgical history and problem list.   Objective:   Vitals:   07/03/20 0922  BP: 118/75  Pulse: (!) 102  Weight: 252 lb (114.3 kg)    Fetal Status: Fetal Heart Rate (bpm): 143   Movement: Present  Presentation: Vertex  General:  Alert, oriented and cooperative. Patient is in no acute distress.  Skin: Skin is warm and dry. No rash noted.   Cardiovascular: Normal heart rate noted  Respiratory: Normal respiratory effort, no problems with respiration noted  Abdomen: Soft, gravid, appropriate for gestational age.  Pain/Pressure: Present     Pelvic: Cervical exam performed in the presence of a chaperone Dilation: Closed Effacement (%): Thick Station: Ballotable  Extremities: Normal range of motion.  Edema: None  Mental Status: Normal mood and affect. Normal behavior. Normal judgment and thought content.   Imaging: [place] MFM FETAL BPP W/NONSTRESS  Result Date: 07/02/2020 ----------------------------------------------------------------------  OBSTETRICS REPORT                       (Signed Final 07/02/2020 10:42 am)  ---------------------------------------------------------------------- Patient Info  ID #:       [id number]                          D.O.B.:  07/16/1990 (30 yrs)  Name:       Karen Soto                  Visit Date: 07/02/2020 07:40 am ---------------------------------------------------------------------- Performed By  Attending:        Noralee Space MD        Ref. Address:     [address]                                                             [address]  Performed By:     Reinaldo Raddle            [facility]  Referred By:      [facility] [place] ---------------------------------------------------------------------- Orders  #  Description                           Code        Ordered By  1  [place] MFM UA CORD DOPPLER                [id number]    RAVI SHANKAR  2  [place] MFM FETAL BPP                      [id number]     RAVI SHANKAR     W/NONSTRESS  3  [place] MFM OB FOLLOW UP                   [id number]    RAVI [facility] ----------------------------------------------------------------------  #  Order #                     Accession #                Episode #  1  [id number]                   [id number]                 [id number]  2  [id number]                   [id number]                 [id number]  3  [id number]                   [id number]                 [id number] ---------------------------------------------------------------------- Indications  [redacted] weeks gestation of pregnancy                Z3A.35  Maternal care for known or suspected poor      O36.5930  fetal growth, third trimester, not applicable or  unspecified IUGR  Echogenic intracardiac focus of the heart      O35.8XX0  (EIF)  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, research (medical) Trait)             Z14.8  Poor obstetric history: Previous gestational   O09.299  diabetes   Obesity complicating pregnancy, third          O99.213  trimester (pregravid BMI 30)  Marginal insertion of umbilical cord affecting O43.193  management of mother in third trimester ---------------------------------------------------------------------- Fetal Evaluation  Num Of Fetuses:         1  Fetal Heart Rate(bpm):  135  Cardiac Activity:       Observed  Presentation:           Cephalic  Placenta:               Posterior Fundal  P. Cord Insertion:      Marginal insertion  Amniotic Fluid  AFI FV:      Within normal limits  AFI Sum(cm)     %Tile       Largest Pocket(cm)  15.47           56          6.24  RUQ(cm)       RLQ(cm)       LUQ(cm)        LLQ(cm)  3.34          6.24          2.44           3.45 ---------------------------------------------------------------------- Biophysical Evaluation  Amniotic F.V:   Pocket => 2 cm             F. Tone:        Observed  F. Movement:    Observed                   N.S.T:          Reactive  F. Breathing:   Not Observed               Score:          8/10 ---------------------------------------------------------------------- Biometry  BPD:      84.6  mm     G. Age:  34w 1d         21  %    CI:        76.87   %    70 - 86                                                          FL/HC:      20.3   %    20.1 - 22.3  HC:      305.6  mm     G. Age:  34w 0d        3.5  %    HC/AC:      1.01        0.93 - 1.11  AC:      301.4  mm     G. Age:  34w 1d         24  %    FL/BPD:     73.2   %    71 - 87  FL:       61.9  mm     G. Age:  32w 1d        < 1  %    FL/AC:      20.5   %    20 - 24  Est. FW:    2216  gm    4 lb 14 oz      10  % ---------------------------------------------------------------------- OB History  Gravidity:    2         Term:   1  Living:       1 ---------------------------------------------------------------------- Gestational Age  U/S Today:     33w 4d                                        EDD:   08/16/20  Best:          35w 2d     Det. By:  Previous  Ultrasound       EDD:   08/04/20                                      (12/20/19) ---------------------------------------------------------------------- Anatomy  Ventricles:            Appears normal         Kidneys:                Appear normal  Diaphragm:             Appears normal         Bladder:                Appears normal  Stomach:               Appears normal, left                         sided ---------------------------------------------------------------------- Doppler - Fetal Vessels  Umbilical Artery   S/D     %tile      RI    %tile                             ADFV    RDFV   2.62       61    0.62       68                                No      No ---------------------------------------------------------------------- Cervix Uterus Adnexa  Cervix  Not visualized (advanced GA >24wks)  Uterus  No abnormality visualized.  Right Ovary  Not visualized.  Left Ovary  Not visualized.  Cul De Sac  No free fluid seen.  Adnexa  No adnexal mass visualized. ---------------------------------------------------------------------- Impression  Fetal growth restriction. Patient return for fetal growth  assessment and antenatal testing. On ultrasound performed  3 weeks ago, the estimated fetal weight was at the 3rd  percentile. Blood pressure today at her office is 116/68  mmHg. Patient does not have gestational diabetes.  On today's ultrasound, amniotic fluid is normal and good fetal  activity seen. The estimated fetal weight is at the 10th  percentile and the abdominal circumference measurement is  at the 24th percentile. Umbilical artery Doppler showed  normal forward diastolic flow. Fetal breathing did not meet the  criteria of BPP. NST is reactive. BPP 8/10.  I reassured the patient of the findings.  I discussed timing of delivery and recommended delivery at  [redacted] weeks gestation. ---------------------------------------------------------------------- Recommendations  Continue weekly BPP and UA Doppler studies till delivery.  ----------------------------------------------------------------------                  Noralee Space, MD Electronically Signed Final Report   07/02/2020 10:42 am ----------------------------------------------------------------------  [place] MFM OB FOLLOW UP  Result Date: 07/02/2020 ----------------------------------------------------------------------  OBSTETRICS REPORT                       (Signed Final 07/02/2020 10:42 am) ---------------------------------------------------------------------- Patient Info  ID #:       [id number]                          D.O.B.:  [birth year] (30 yrs)  Name:       MISK Cauthon                  Visit Date: 07/02/2020 07:40 am ---------------------------------------------------------------------- Performed By  Attending:        Noralee Space MD        Ref. Address:     [address]  Performed By:     Reinaldo Raddle            [facility]  Referred By:      [facility] [place] ---------------------------------------------------------------------- Orders  #  Description                           Code        Ordered By  1  [place] MFM UA CORD DOPPLER                76820.02    RAVI SHANKAR  2  [place] MFM FETAL BPP                      64332.9     RAVI SHANKAR     W/NONSTRESS  3  [place] MFM OB FOLLOW UP                   [id number]    RAVI [facility] ----------------------------------------------------------------------  #  Order #                     Accession #                Episode #  1  [id number]                   [id number]                 [id number]  2  [id number]                   [id number]  [id number]  3  [id number]                   [id number]                 [id number]  ---------------------------------------------------------------------- Indications  [redacted] weeks gestation of pregnancy                Z3A.35  Maternal care for known or suspected poor      O36.5930  fetal growth, third trimester, not applicable or  unspecified IUGR  Echogenic intracardiac focus of the heart      O35.8XX0  (EIF)  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, research (medical) Trait)             Z14.8  Poor obstetric history: Previous gestational   O09.299  diabetes  Obesity complicating pregnancy, third          O99.213  trimester (pregravid BMI 30)  Marginal insertion of umbilical cord affecting O43.193  management of mother in third trimester ---------------------------------------------------------------------- Fetal Evaluation  Num Of Fetuses:         1  Fetal Heart Rate(bpm):  135  Cardiac Activity:       Observed  Presentation:           Cephalic  Placenta:               Posterior Fundal  P. Cord Insertion:      Marginal insertion  Amniotic Fluid  AFI FV:      Within normal limits  AFI Sum(cm)     %Tile       Largest Pocket(cm)  15.47           56          6.24  RUQ(cm)       RLQ(cm)       LUQ(cm)        LLQ(cm)  3.34          6.24          2.44           3.45 ---------------------------------------------------------------------- Biophysical Evaluation  Amniotic F.V:   Pocket => 2 cm             F. Tone:        Observed  F. Movement:    Observed                   N.S.T:          Reactive  F. Breathing:   Not Observed               Score:          8/10 ---------------------------------------------------------------------- Biometry  BPD:      84.6  mm     G. Age:  34w 1d         21  %    CI:        76.87   %    70 - 86                                                          FL/HC:      20.3   %    20.1 - 22.3  HC:      305.6  mm     G. Age:  34w 0d        3.5  %    HC/AC:      1.01        0.93 - 1.11  AC:      301.4  mm     G. Age:  34w 1d         24  %    FL/BPD:     73.2   %    71 - 87  FL:       61.9  mm     G. Age:  32w 1d         < 1  %    FL/AC:      20.5   %    20 - 24  Est. FW:    2216  gm    4 lb 14 oz      10  % ---------------------------------------------------------------------- OB History  Gravidity:    2         Term:   1  Living:       1 ---------------------------------------------------------------------- Gestational Age  U/S Today:     33w 4d                                        EDD:   08/16/20  Best:          35w 2d     Det. By:  Previous Ultrasound      EDD:   08/04/20                                      (12/20/19) ---------------------------------------------------------------------- Anatomy  Ventricles:            Appears normal         Kidneys:                Appear normal  Diaphragm:             Appears normal         Bladder:                Appears normal  Stomach:               Appears normal, left                         sided ---------------------------------------------------------------------- Doppler - Fetal Vessels  Umbilical Artery   S/D     %tile      RI    %tile                             ADFV    RDFV   2.62       61    0.62       68                                No      No ---------------------------------------------------------------------- Cervix Uterus Adnexa  Cervix  Not visualized (advanced GA >24wks)  Uterus  No abnormality visualized.  Right Ovary  Not visualized.  Left Ovary  Not visualized.  Cul De Sac  No free fluid seen.  Adnexa  No adnexal  mass visualized. ---------------------------------------------------------------------- Impression  Fetal growth restriction. Patient return for fetal growth  assessment and antenatal testing. On ultrasound performed  3 weeks ago, the estimated fetal weight was at the 3rd  percentile. Blood pressure today at her office is 116/68  mmHg. Patient does not have gestational diabetes.  On today's ultrasound, amniotic fluid is normal and good fetal  activity seen. The estimated fetal weight is at the 10th  percentile and the abdominal circumference measurement is   at the 24th percentile. Umbilical artery Doppler showed  normal forward diastolic flow. Fetal breathing did not meet the  criteria of BPP. NST is reactive. BPP 8/10.  I reassured the patient of the findings.  I discussed timing of delivery and recommended delivery at  [redacted] weeks gestation. ---------------------------------------------------------------------- Recommendations  Continue weekly BPP and UA Doppler studies till delivery. ----------------------------------------------------------------------                  Noralee Space, MD Electronically Signed Final Report   07/02/2020 10:42 am ----------------------------------------------------------------------  [place] MFM UA CORD DOPPLER  Result Date: 07/02/2020 ----------------------------------------------------------------------  OBSTETRICS REPORT                       (Signed Final 07/02/2020 10:42 am) ---------------------------------------------------------------------- Patient Info  ID #:       [id number]                          D.O.B.:  08-29-1990 (30 yrs)  Name:       VXBLTJQZ Beaubien                  Visit Date: 07/02/2020 07:40 am ---------------------------------------------------------------------- Performed By  Attending:        Noralee Space MD        Ref. Address:     [address]  Performed By:     Reinaldo Raddle            [facility]  Referred  By:      [facility] [place] ---------------------------------------------------------------------- Orders  #  Description                           Code        Ordered By  1  US MFM UA CORD DOPPLER                76820.02    RAVI SHANKAR  2  US MFM FETAL BPP                      [id number]     RAVI SHANKAR     W/NONSTRESS   3  US MFM OB FOLLOW UP                   [id number]    [facility] ----------------------------------------------------------------------  #  Order #                     Accession #                Episode #  1  [id number]                   [id number]  2  [id number]                   [id number]  3  [id number]                   [id number]                 [id number] ---------------------------------------------------------------------- Indications  [redacted] weeks gestation of pregnancy                Z3A.35  Maternal care for known or suspected poor      O36.5930  fetal growth, third trimester, not applicable or  unspecified IUGR  Echogenic intracardiac focus of the heart      O35.8XX0  (EIF)  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, research (medical)(Alpha Thal Trait)             Z14.8  Poor obstetric history: Previous gestational   O09.299  diabetes  Obesity complicating pregnancy, third          O99.213  trimester (pregravid BMI 30)  Marginal insertion of umbilical cord affecting O43.193  management of mother in third trimester ---------------------------------------------------------------------- Fetal Evaluation  Num Of Fetuses:         1  Fetal Heart Rate(bpm):  135  Cardiac Activity:       Observed  Presentation:           Cephalic  Placenta:               Posterior Fundal  P. Cord Insertion:      Marginal insertion  Amniotic Fluid  AFI FV:      Within normal limits  AFI Sum(cm)     %Tile       Largest Pocket(cm)  15.47           56          6.24  RUQ(cm)       RLQ(cm)       LUQ(cm)        LLQ(cm)  3.34  6.24          2.44           3.45 ---------------------------------------------------------------------- Biophysical Evaluation  Amniotic F.V:   Pocket => 2 cm             F. Tone:        Observed  F. Movement:    Observed                   N.S.T:          Reactive  F. Breathing:   Not Observed               Score:          8/10 ----------------------------------------------------------------------  Biometry  BPD:      84.6  mm     G. Age:  34w 1d         21  %    CI:        76.87   %    70 - 86                                                          FL/HC:      20.3   %    20.1 - 22.3  HC:      305.6  mm     G. Age:  34w 0d        3.5  %    HC/AC:      1.01        0.93 - 1.11  AC:      301.4  mm     G. Age:  34w 1d         24  %    FL/BPD:     73.2   %    71 - 87  FL:       61.9  mm     G. Age:  32w 1d        < 1  %    FL/AC:      20.5   %    20 - 24  Est. FW:    2216  gm    4 lb 14 oz      10  % ---------------------------------------------------------------------- OB History  Gravidity:    2         Term:   1  Living:       1 ---------------------------------------------------------------------- Gestational Age  U/S Today:     33w 4d                                        EDD:   08/16/20  Best:          35w 2d     Det. By:  Previous Ultrasound      EDD:   08/04/20                                      (12/20/19) ---------------------------------------------------------------------- Anatomy  Ventricles:            Appears normal  Kidneys:                Appear normal  Diaphragm:             Appears normal         Bladder:                Appears normal  Stomach:               Appears normal, left                         sided ---------------------------------------------------------------------- Doppler - Fetal Vessels  Umbilical Artery   S/D     %tile      RI    %tile                             ADFV    RDFV   2.62       61    0.62       68                                No      No ---------------------------------------------------------------------- Cervix Uterus Adnexa  Cervix  Not visualized (advanced GA >24wks)  Uterus  No abnormality visualized.  Right Ovary  Not visualized.  Left Ovary  Not visualized.  Cul De Sac  No free fluid seen.  Adnexa  No adnexal mass visualized. ---------------------------------------------------------------------- Impression  Fetal growth restriction. Patient return  for fetal growth  assessment and antenatal testing. On ultrasound performed  3 weeks ago, the estimated fetal weight was at the 3rd  percentile. Blood pressure today at her office is 116/68  mmHg. Patient does not have gestational diabetes.  On today's ultrasound, amniotic fluid is normal and good fetal  activity seen. The estimated fetal weight is at the 10th  percentile and the abdominal circumference measurement is  at the 24th percentile. Umbilical artery Doppler showed  normal forward diastolic flow. Fetal breathing did not meet the  criteria of BPP. NST is reactive. BPP 8/10.  I reassured the patient of the findings.  I discussed timing of delivery and recommended delivery at  [redacted] weeks gestation. ---------------------------------------------------------------------- Recommendations  Continue weekly BPP and UA Doppler studies till delivery. ----------------------------------------------------------------------                  Noralee Space, MD Electronically Signed Final Report   07/02/2020 10:42 am ----------------------------------------------------------------------   Assessment and Plan:  Pregnancy: G2P1001 at [redacted]w[redacted]d 1. IUGR (intrauterine growth restriction) affecting care of mother, third trimester, fetus 1 2. [redacted] weeks gestation of pregnancy 3. Supervision of high risk pregnancy, antepartum, third trimester Imaging studies reviewed, EFW 10%, normal dopplers, cephalic presentation. IOL recommended at 38 weeks.  Risks and benefits of induction were reviewed, all questions answered.  Induction of labor scheduled on 07/21/20 morning, orders have been signed and held.  She was told to expect a call from [facility] L&D with further instructions about her pre-admission COVID screening and any further instructions about the induction of labor. She was told that she will be called during the morning of 07/21/20 between 6:30am -9 am to come in for their induction as soon as the rooms and staff are ready for her.   Pelvic cultures done today, will follow up results and manage accordingly. Continue weekly antenatal testing as per  MFM. - Cervicovaginal ancillary only - Strep Gp B NAA  Preterm labor symptoms and general obstetric precautions including but not limited to vaginal bleeding, contractions, leaking of fluid and fetal movement were reviewed in detail with the patient. Please refer to After Visit Summary for other counseling recommendations.   Return in about 1 week (around 07/10/2020) for OFFICE OB VISIT (MD only).  Future Appointments  Date Time Provider Department Center  07/09/2020  7:15 AM [facility] NURSE [facility]  07/09/2020  7:30 AM [facility] US3 [facility]  07/16/2020  7:30 AM [facility] NURSE [facility]  07/16/2020  7:45 AM [facility] US5 [facility]    Jaynie Collins, MD

## 2020-07-03 NOTE — Patient Instructions (Signed)
Return to office for any scheduled appointments. Call the office or go to the MAU at [facility] if:  You begin to have strong, frequent contractions  Your water breaks.  Sometimes it is a big gush of fluid, sometimes it is just a trickle that keeps getting your panties wet or running down your legs  You have vaginal bleeding.  It is normal to have a small amount of spotting if your cervix was checked.   You do not feel your baby moving like normal.  If you do not, get something to eat and drink and lay down and focus on feeling your baby move.   If your baby is still not moving like normal, you should call the office or go to MAU.  Any other obstetric concerns.   Signs and Symptoms of Labor Labor is the body's natural process of moving the baby and the placenta out of the uterus. The process of labor usually starts when the baby is full-term, between 37 and 40 weeks of pregnancy. Signs and symptoms that you are close to going into labor As your body prepares for labor and the birth of your baby, you may notice the following symptoms in the weeks and days before true labor starts:  Passing a small amount of thick, bloody mucus from your vagina. This is called normal bloody show or losing your mucus plug. This may happen more than a week before labor begins, or right before labor begins, as the opening of the cervix starts to widen (dilate). For some women, the entire mucus plug passes at once. For others, pieces of the mucus plug may gradually pass over several days.  Your baby moving (dropping) lower in your pelvis to get into position for birth (lightening). When this happens, you may feel more pressure on your bladder and pelvic bone and less pressure on your ribs. This may make it easier to breathe. It may also cause you to need to urinate more often and have problems with bowel movements.  Having "practice contractions," also called Braxton Hicks contractions  or false labor. These occur at irregular (unevenly spaced) intervals that are more than 10 minutes apart. False labor contractions are common after exercise or sexual activity. They will stop if you change position, rest, or drink fluids. These contractions are usually mild and do not get stronger over time. They may feel like: ? A backache or back pain. ? Mild cramps, similar to menstrual cramps. ? Tightening or pressure in your abdomen. Other early symptoms include:  Nausea or loss of appetite.  Diarrhea.  Having a sudden burst of energy, or feeling very tired.  Mood changes.  Having trouble sleeping.   Signs and symptoms that labor has begun Signs that you are in labor may include:  Having contractions that come at regular (evenly spaced) intervals and increase in intensity. This may feel like more intense tightening or pressure in your abdomen that moves to your back. ? Contractions may also feel like rhythmic pain in your upper thighs or back that comes and goes at regular intervals. ? For first-time mothers, this change in intensity of contractions often occurs at a more gradual pace. ? Women who have given birth before may notice a more rapid progression of contraction changes.  Feeling pressure in the vaginal area.  Your water breaking (rupture of membranes). This is when the sac of fluid that surrounds your baby breaks. Fluid leaking from your vagina may be   clear or blood-tinged. Labor usually starts within 24 hours of your water breaking, but it may take longer to begin. ? Some women may feel a sudden gush of fluid. ? Others notice that their underwear repeatedly becomes damp. Follow these instructions at home:  When labor starts, or if your water breaks, call your health care provider or nurse care line. Based on your situation, they will determine when you should go in for an exam.  During early labor, you may be able to rest and manage symptoms at home. Some strategies to  try at home include: ? Breathing and relaxation techniques. ? Taking a warm bath or shower. ? Listening to music. ? Using a heating pad on the lower back for pain. If you are directed to use heat:  Place a towel between your skin and the heat source.  Leave the heat on for 20-30 minutes.  Remove the heat if your skin turns bright red. This is especially important if you are unable to feel pain, heat, or cold. You may have a greater risk of getting burned.   Contact a health care provider if:  Your labor has started.  Your water breaks. Get help right away if:  You have painful, regular contractions that are 5 minutes apart or less.  Labor starts before you are [redacted] weeks along in your pregnancy.  You have a fever.  You have bright red blood coming from your vagina.  You do not feel your baby moving.  You have a severe headache with or without vision problems.  You have severe nausea, vomiting, or diarrhea.  You have chest pain or shortness of breath. These symptoms may represent a serious problem that is an emergency. Do not wait to see if the symptoms will go away. Get medical help right away. Call your local emergency services (911 in the [place] Do not drive yourself to the hospital. Summary  Labor is your body's natural process of moving your baby and the placenta out of your uterus.  The process of labor usually starts when your baby is full-term, between 91 and 40 weeks of pregnancy.  When labor starts, or if your water breaks, call your health care provider or nurse care line. Based on your situation, they will determine when you should go in for an exam. This information is not intended to replace advice given to you by your health care provider. Make sure you discuss any questions you have with your health care provider. Document Revised: 03/02/2020 Document Reviewed: 03/02/2020 Elsevier Patient Education  2021 [employer].  Labor Induction Labor induction is  when steps are taken to cause a pregnant woman to begin the labor process. Most women go into labor on their own between 37 weeks and 42 weeks of pregnancy. When this does not happen, or when there is a medical need for labor to begin, steps may be taken to induce, or bring on, labor. Labor induction causes a pregnant woman's uterus to contract. It also causes the cervix to soften (ripen), open (dilate), and thin out. Usually, labor is not induced before 39 weeks of pregnancy unless there is a medical reason to do so. When is labor induction considered? Labor induction may be right for you if:  Your pregnancy lasts longer than 41 to 42 weeks.  Your placenta is separating from your uterus (placental abruption).  You have a rupture of membranes and your labor does not begin.  You have health problems, like diabetes or high blood  pressure (preeclampsia) during your pregnancy.  Your baby has stopped growing or does not have enough amniotic fluid. Before labor induction begins, your health care provider will consider the following factors:  Your medical condition and the baby's condition.  How many weeks you have been pregnant.  How mature the baby's lungs are.  The condition of your cervix.  The position of the baby.  The size of your birth canal. Tell a health care provider about:  Any allergies you have.  All medicines you are taking, including vitamins, herbs, eye drops, creams, and over-the-counter medicines.  Any problems you or your family members have had with anesthetic medicines.  Any surgeries you have had.  Any blood disorders you have.  Any medical conditions you have. What are the risks? Generally, this is a safe procedure. However, problems may occur, including:  Failed induction.  Changes in fetal heart rate, such as being too high, too low, or irregular (erratic).  Infection in the mother or the baby.  Increased risk of having a cesarean  delivery.  Breaking off (abruption) of the placenta from the uterus. This is rare.  Rupture of the uterus. This is very rare.  Your baby could fail to get enough blood flow or oxygen. This can be life-threatening. When induction is needed for medical reasons, the benefits generally outweigh the risks. What happens during the procedure? During the procedure, your health care provider will use one of these methods to induce labor:  Stripping the membranes. In this method, the amniotic sac tissue is gently separated from the cervix. This causes the following to happen: ? Your cervix stretches, which in turn causes the release of prostaglandins. ? Prostaglandins induce labor and cause the uterus to contract. ? This procedure is often done in an office visit. You will be sent home to wait for contractions to begin.  Prostaglandin medicine. This medicine starts contractions and causes the cervix to dilate and ripen. This can be taken by mouth (orally) or by being inserted into the vagina (suppository).  Inserting a small, thin tube (catheter) with a balloon into the vagina and then expanding the balloon with water to dilate the cervix.  Breaking the water. In this method, a small instrument is used to make a small hole in the amniotic sac. This eventually causes the amniotic sac to break. Contractions should begin within a few hours.  Medicine to trigger or strengthen contractions. This medicine is given through an IV that is inserted into a vein in your arm. This procedure may vary among health care providers and hospitals.   Where to find more information  [employer]: www.marchofdimes.org  [employer]: www.acog.org Summary  Labor induction causes a pregnant woman's uterus to contract. It also causes the cervix to soften (ripen), open (dilate), and thin out.  Labor is usually not induced before 39 weeks of pregnancy unless there is a medical  reason to do so.  When induction is needed for medical reasons, the benefits generally outweigh the risks.  Talk with your health care provider about which methods of labor induction are right for you. This information is not intended to replace advice given to you by your health care provider. Make sure you discuss any questions you have with your health care provider. Document Revised: 02/22/2020 Document Reviewed: 02/22/2020 Elsevier Patient Education  2021 [employer].

## 2020-07-04 LAB — CERVICOVAGINAL ANCILLARY ONLY
Chlamydia: NEGATIVE
Comment: NEGATIVE
Comment: NEGATIVE
Comment: NORMAL
Neisseria Gonorrhea: NEGATIVE
Trichomonas: NEGATIVE

## 2020-07-05 LAB — STREP GP B NAA: Strep Gp B NAA: NEGATIVE

## 2020-07-09 ENCOUNTER — Other Ambulatory Visit: Payer: Self-pay

## 2020-07-09 ENCOUNTER — Ambulatory Visit: Payer: Medicaid Other | Admitting: *Deleted

## 2020-07-09 ENCOUNTER — Encounter: Payer: Self-pay | Admitting: *Deleted

## 2020-07-09 ENCOUNTER — Ambulatory Visit: Payer: Medicaid Other | Attending: Obstetrics and Gynecology

## 2020-07-09 DIAGNOSIS — O365990 Maternal care for other known or suspected poor fetal growth, unspecified trimester, not applicable or unspecified: Secondary | ICD-10-CM | POA: Insufficient documentation

## 2020-07-09 DIAGNOSIS — O0993 Supervision of high risk pregnancy, unspecified, third trimester: Secondary | ICD-10-CM

## 2020-07-09 DIAGNOSIS — O358XX0 Maternal care for other (suspected) fetal abnormality and damage, not applicable or unspecified: Secondary | ICD-10-CM | POA: Diagnosis not present

## 2020-07-09 DIAGNOSIS — O09293 Supervision of pregnancy with other poor reproductive or obstetric history, third trimester: Secondary | ICD-10-CM | POA: Diagnosis not present

## 2020-07-09 DIAGNOSIS — O43193 Other malformation of placenta, third trimester: Secondary | ICD-10-CM

## 2020-07-09 DIAGNOSIS — E669 Obesity, unspecified: Secondary | ICD-10-CM

## 2020-07-09 DIAGNOSIS — O352XX0 Maternal care for (suspected) hereditary disease in fetus, not applicable or unspecified: Secondary | ICD-10-CM

## 2020-07-09 DIAGNOSIS — Z148 Genetic carrier of other disease: Secondary | ICD-10-CM

## 2020-07-09 DIAGNOSIS — O99213 Obesity complicating pregnancy, third trimester: Secondary | ICD-10-CM

## 2020-07-09 DIAGNOSIS — O365930 Maternal care for other known or suspected poor fetal growth, third trimester, not applicable or unspecified: Secondary | ICD-10-CM | POA: Diagnosis not present

## 2020-07-09 DIAGNOSIS — Z3A36 36 weeks gestation of pregnancy: Secondary | ICD-10-CM

## 2020-07-10 ENCOUNTER — Encounter: Payer: Self-pay | Admitting: Obstetrics and Gynecology

## 2020-07-10 ENCOUNTER — Ambulatory Visit (INDEPENDENT_AMBULATORY_CARE_PROVIDER_SITE_OTHER): Payer: Medicaid Other | Admitting: Obstetrics and Gynecology

## 2020-07-10 ENCOUNTER — Other Ambulatory Visit: Payer: Self-pay

## 2020-07-10 VITALS — BP 120/80 | HR 90 | Wt 258.0 lb

## 2020-07-10 DIAGNOSIS — Z3A36 36 weeks gestation of pregnancy: Secondary | ICD-10-CM

## 2020-07-10 DIAGNOSIS — Z3009 Encounter for other general counseling and advice on contraception: Secondary | ICD-10-CM | POA: Insufficient documentation

## 2020-07-10 DIAGNOSIS — O365931 Maternal care for other known or suspected poor fetal growth, third trimester, fetus 1: Secondary | ICD-10-CM

## 2020-07-10 DIAGNOSIS — D509 Iron deficiency anemia, unspecified: Secondary | ICD-10-CM

## 2020-07-10 DIAGNOSIS — O99013 Anemia complicating pregnancy, third trimester: Secondary | ICD-10-CM

## 2020-07-10 DIAGNOSIS — O0993 Supervision of high risk pregnancy, unspecified, third trimester: Secondary | ICD-10-CM

## 2020-07-10 DIAGNOSIS — O26899 Other specified pregnancy related conditions, unspecified trimester: Secondary | ICD-10-CM

## 2020-07-10 DIAGNOSIS — Z6791 Unspecified blood type, Rh negative: Secondary | ICD-10-CM

## 2020-07-10 NOTE — Progress Notes (Addendum)
   PRENATAL VISIT NOTE  Subjective:  Karen Soto is a 30 y.o. G2P1001 at [redacted]w[redacted]d being seen today for ongoing prenatal care.  She is currently monitored for the following issues for this high-risk pregnancy and has Obesity in pregnancy, antepartum; Rh negative state in antepartum period; Supervision of high risk pregnancy, antepartum, third trimester; Alpha thalassemia trait; Maternal iron deficiency anemia affecting pregnancy in third trimester, antepartum; IUGR (intrauterine growth restriction) affecting care of mother, third trimester, fetus 1; and Unwanted fertility on their problem list.  Patient reports no complaints.  Contractions: Not present. Vag. Bleeding: None.  Movement: Present. Denies leaking of fluid.   The following portions of the patient's history were reviewed and updated as appropriate: allergies, current medications, past family history, past medical history, past social history, past surgical history and problem list.   Objective:   Vitals:   07/10/20 1549  BP: 120/80  Pulse: 90  Weight: 258 lb (117 kg)    Fetal Status: Fetal Heart Rate (bpm): 131   Movement: Present     General:  Alert, oriented and cooperative. Patient is in no acute distress.  Skin: Skin is warm and dry. No rash noted.   Cardiovascular: Normal heart rate noted  Respiratory: Normal respiratory effort, no problems with respiration noted  Abdomen: Soft, gravid, appropriate for gestational age.  Pain/Pressure: Present     Pelvic: Cervical exam deferred        Extremities: Normal range of motion.  Edema: Trace  Mental Status: Normal mood and affect. Normal behavior. Normal judgment and thought content.   Assessment and Plan:  Pregnancy: G2P1001 at [redacted]w[redacted]d  1. Supervision of high risk pregnancy, antepartum, third trimester  2. Rh negative state in antepartum period  3. IUGR (intrauterine growth restriction) affecting care of mother, third trimester, fetus 1 10th%tile, last was  3rd%tile Recommended for IOL 38-39 weeks, previously scheduled Reviewed reasoning and risks/benefits, they are agreeable  4. Maternal iron deficiency anemia affecting pregnancy in third trimester, antepartum  5. Unwanted fertility  6. [redacted] weeks gestation of pregnancy   Preterm labor symptoms and general obstetric precautions including but not limited to vaginal bleeding, contractions, leaking of fluid and fetal movement were reviewed in detail with the patient. Please refer to After Visit Summary for other counseling recommendations.   Return in about 1 week (around 07/17/2020) for high OB, in person.  Future Appointments  Date Time Provider Department Center  07/16/2020  7:30 AM [facility] NURSE [facility]  07/16/2020  7:45 AM [facility] US5 [facility]  07/17/2020  8:15 AM Levie Heritage, DO [facility] None  07/21/2020  8:00 AM [facility] SCHED ROOM [facility] None    Conan Bowens, MD

## 2020-07-11 ENCOUNTER — Telehealth (HOSPITAL_COMMUNITY): Payer: Self-pay | Admitting: *Deleted

## 2020-07-11 NOTE — Telephone Encounter (Signed)
Preadmission screen  

## 2020-07-14 ENCOUNTER — Other Ambulatory Visit: Payer: Self-pay | Admitting: Advanced Practice Midwife

## 2020-07-15 ENCOUNTER — Telehealth (HOSPITAL_COMMUNITY): Payer: Self-pay | Admitting: *Deleted

## 2020-07-15 ENCOUNTER — Telehealth: Payer: Self-pay

## 2020-07-15 ENCOUNTER — Encounter (HOSPITAL_COMMUNITY): Payer: Self-pay | Admitting: *Deleted

## 2020-07-15 NOTE — Telephone Encounter (Signed)
Preadmission screen  

## 2020-07-15 NOTE — Telephone Encounter (Signed)
Pt called in regards to back cramps. Advised pt back cramps are normal during pregnancy. Asked pt if she was experiencing any vaginal bleeding or leakage of fluid. Pt was not able to answer that question at this time and advises she will call the office back. Have not heard back from pt yet.

## 2020-07-16 ENCOUNTER — Ambulatory Visit: Payer: Medicaid Other | Admitting: *Deleted

## 2020-07-16 ENCOUNTER — Ambulatory Visit: Payer: Medicaid Other | Attending: Obstetrics and Gynecology

## 2020-07-16 ENCOUNTER — Other Ambulatory Visit: Payer: Self-pay

## 2020-07-16 ENCOUNTER — Encounter: Payer: Self-pay | Admitting: *Deleted

## 2020-07-16 DIAGNOSIS — O365930 Maternal care for other known or suspected poor fetal growth, third trimester, not applicable or unspecified: Secondary | ICD-10-CM | POA: Diagnosis present

## 2020-07-16 DIAGNOSIS — O0993 Supervision of high risk pregnancy, unspecified, third trimester: Secondary | ICD-10-CM | POA: Insufficient documentation

## 2020-07-17 ENCOUNTER — Encounter: Payer: Medicaid Other | Admitting: Family Medicine

## 2020-07-18 ENCOUNTER — Ambulatory Visit (INDEPENDENT_AMBULATORY_CARE_PROVIDER_SITE_OTHER): Payer: Medicaid Other | Admitting: Family Medicine

## 2020-07-18 ENCOUNTER — Other Ambulatory Visit: Payer: Self-pay

## 2020-07-18 VITALS — BP 106/75 | HR 74 | Wt 263.0 lb

## 2020-07-18 DIAGNOSIS — O0993 Supervision of high risk pregnancy, unspecified, third trimester: Secondary | ICD-10-CM

## 2020-07-18 DIAGNOSIS — O26899 Other specified pregnancy related conditions, unspecified trimester: Secondary | ICD-10-CM

## 2020-07-18 DIAGNOSIS — O365931 Maternal care for other known or suspected poor fetal growth, third trimester, fetus 1: Secondary | ICD-10-CM

## 2020-07-18 DIAGNOSIS — O99013 Anemia complicating pregnancy, third trimester: Secondary | ICD-10-CM

## 2020-07-18 DIAGNOSIS — Z6791 Unspecified blood type, Rh negative: Secondary | ICD-10-CM

## 2020-07-18 DIAGNOSIS — D509 Iron deficiency anemia, unspecified: Secondary | ICD-10-CM

## 2020-07-18 NOTE — Progress Notes (Signed)
   PRENATAL VISIT NOTE  Subjective:  Karen Soto is a 30 y.o. G2P1001 at [redacted]w[redacted]d being seen today for ongoing prenatal care.  She is currently monitored for the following issues for this high-risk pregnancy and has Obesity in pregnancy, antepartum; Rh negative state in antepartum period; Supervision of high risk pregnancy, antepartum, third trimester; Alpha thalassemia trait; Maternal iron deficiency anemia affecting pregnancy in third trimester, antepartum; IUGR (intrauterine growth restriction) affecting care of mother, third trimester, fetus 1; and Unwanted fertility on their problem list.  Patient reports no complaints.  Contractions: Irritability. Vag. Bleeding: None.  Movement: Present. Denies leaking of fluid.   The following portions of the patient's history were reviewed and updated as appropriate: allergies, current medications, past family history, past medical history, past social history, past surgical history and problem list.   Objective:   Vitals:   07/18/20 0848  BP: 106/75  Pulse: 74  Weight: 263 lb (119.3 kg)    Fetal Status: Fetal Heart Rate (bpm): 145   Movement: Present     General:  Alert, oriented and cooperative. Patient is in no acute distress.  Skin: Skin is warm and dry. No rash noted.   Cardiovascular: Normal heart rate noted  Respiratory: Normal respiratory effort, no problems with respiration noted  Abdomen: Soft, gravid, appropriate for gestational age.  Pain/Pressure: Present     Pelvic: Cervical exam deferred        Extremities: Normal range of motion.  Edema: Trace  Mental Status: Normal mood and affect. Normal behavior. Normal judgment and thought content.   Assessment and Plan:  Pregnancy: G2P1001 at [redacted]w[redacted]d 1. Supervision of high risk pregnancy, antepartum, third trimester FHT and FH normal  2. Rh negative state in antepartum period Rhogam given at 28 weeks  3. IUGR (intrauterine growth restriction) affecting care of mother, third trimester,  fetus 1 Induction scheduled for Sunday  4. Maternal iron deficiency anemia affecting pregnancy in third trimester, antepartum  Term labor symptoms and general obstetric precautions including but not limited to vaginal bleeding, contractions, leaking of fluid and fetal movement were reviewed in detail with the patient. Please refer to After Visit Summary for other counseling recommendations.   No follow-ups on file.  Future Appointments  Date Time Provider Department Center  07/19/2020  9:45 AM [facility] None  07/21/2020  8:00 AM [facility] SCHED ROOM [facility] None  08/15/2020 10:45 AM Levie Heritage, DO [facility] None    Levie Heritage, DO

## 2020-07-18 NOTE — Progress Notes (Signed)
Patient scheduled for induction on Sunday Feb27 Armandina Stammer RN

## 2020-07-19 ENCOUNTER — Other Ambulatory Visit (HOSPITAL_COMMUNITY)
Admission: RE | Admit: 2020-07-19 | Discharge: 2020-07-19 | Disposition: A | Discharge status: Home / Self Care | Payer: Medicaid Other | Source: Ambulatory Visit | Attending: Obstetrics and Gynecology | Admitting: Obstetrics and Gynecology

## 2020-07-19 DIAGNOSIS — Z01812 Encounter for preprocedural laboratory examination: Secondary | ICD-10-CM | POA: Insufficient documentation

## 2020-07-19 DIAGNOSIS — Z20822 Contact with and (suspected) exposure to covid-19: Secondary | ICD-10-CM | POA: Insufficient documentation

## 2020-07-19 LAB — SARS CORONAVIRUS 2 (TAT 6-24 HRS): SARS Coronavirus 2: NEGATIVE

## 2020-07-21 ENCOUNTER — Inpatient Hospital Stay (HOSPITAL_COMMUNITY)
Admission: EM | Admit: 2020-07-21 | Discharge: 2020-07-23 | DRG: 807 | Disposition: A | Discharge status: Home / Self Care | Payer: Medicaid Other | Attending: Obstetrics & Gynecology | Admitting: Obstetrics & Gynecology

## 2020-07-21 ENCOUNTER — Other Ambulatory Visit: Payer: Self-pay

## 2020-07-21 ENCOUNTER — Inpatient Hospital Stay (HOSPITAL_COMMUNITY): Payer: Medicaid Other | Admitting: Anesthesiology

## 2020-07-21 ENCOUNTER — Inpatient Hospital Stay (HOSPITAL_COMMUNITY): Payer: Medicaid Other

## 2020-07-21 ENCOUNTER — Encounter (HOSPITAL_COMMUNITY): Payer: Self-pay | Admitting: Obstetrics & Gynecology

## 2020-07-21 DIAGNOSIS — Z3A38 38 weeks gestation of pregnancy: Secondary | ICD-10-CM

## 2020-07-21 DIAGNOSIS — O26899 Other specified pregnancy related conditions, unspecified trimester: Secondary | ICD-10-CM

## 2020-07-21 DIAGNOSIS — Z6791 Unspecified blood type, Rh negative: Secondary | ICD-10-CM | POA: Diagnosis not present

## 2020-07-21 DIAGNOSIS — O26893 Other specified pregnancy related conditions, third trimester: Secondary | ICD-10-CM | POA: Diagnosis present

## 2020-07-21 DIAGNOSIS — O99214 Obesity complicating childbirth: Secondary | ICD-10-CM | POA: Diagnosis present

## 2020-07-21 DIAGNOSIS — D509 Iron deficiency anemia, unspecified: Secondary | ICD-10-CM | POA: Diagnosis present

## 2020-07-21 DIAGNOSIS — O365931 Maternal care for other known or suspected poor fetal growth, third trimester, fetus 1: Secondary | ICD-10-CM | POA: Diagnosis present

## 2020-07-21 DIAGNOSIS — O9902 Anemia complicating childbirth: Secondary | ICD-10-CM | POA: Diagnosis present

## 2020-07-21 DIAGNOSIS — D563 Thalassemia minor: Secondary | ICD-10-CM | POA: Diagnosis present

## 2020-07-21 DIAGNOSIS — O99210 Obesity complicating pregnancy, unspecified trimester: Secondary | ICD-10-CM | POA: Diagnosis present

## 2020-07-21 DIAGNOSIS — O365930 Maternal care for other known or suspected poor fetal growth, third trimester, not applicable or unspecified: Principal | ICD-10-CM | POA: Diagnosis present

## 2020-07-21 LAB — COMPREHENSIVE METABOLIC PANEL
ALT: 11 U/L (ref 0–44)
AST: 30 U/L (ref 15–41)
Albumin: 2.6 g/dL — ABNORMAL LOW (ref 3.5–5.0)
Alkaline Phosphatase: 157 U/L — ABNORMAL HIGH (ref 38–126)
Anion gap: 12 (ref 5–15)
BUN: <5 mg/dL — ABNORMAL LOW (ref 6–20)
CO2: 18 mmol/L — ABNORMAL LOW (ref 22–32)
Calcium: 8.2 mg/dL — ABNORMAL LOW (ref 8.9–10.3)
Chloride: 106 mmol/L (ref 98–111)
Creatinine, Ser: 0.6 mg/dL (ref 0.44–1.00)
GFR, Estimated: >60 mL/min (ref >60)
Glucose, Bld: 62 mg/dL — ABNORMAL LOW (ref 70–99)
Potassium: 4 mmol/L (ref 3.5–5.1)
Sodium: 136 mmol/L (ref 135–145)
Total Bilirubin: 0.5 mg/dL (ref 0.3–1.2)
Total Protein: 5.6 g/dL — ABNORMAL LOW (ref 6.5–8.1)

## 2020-07-21 LAB — CBC
HCT: 32.7 % — ABNORMAL LOW (ref 36.0–46.0)
Hemoglobin: 10.3 g/dL — ABNORMAL LOW (ref 12.0–15.0)
MCH: 26 pg (ref 26.0–34.0)
MCHC: 31.5 g/dL (ref 30.0–36.0)
MCV: 82.6 fL (ref 80.0–100.0)
Platelets: 230 10*3/uL (ref 150–400)
RBC: 3.96 MIL/uL (ref 3.87–5.11)
RDW: 14.6 % (ref 11.5–15.5)
WBC: 7.6 10*3/uL (ref 4.0–10.5)
nRBC: 0 % (ref 0.0–0.2)

## 2020-07-21 LAB — TYPE AND SCREEN
ABO/RH(D): O NEG
Antibody Screen: POSITIVE

## 2020-07-21 LAB — RPR: RPR Ser Ql: NONREACTIVE

## 2020-07-21 MED ORDER — LACTATED RINGERS IV SOLN: 500.0000 mL | INTRAVENOUS | Status: DC | PRN

## 2020-07-21 MED ORDER — FENTANYL CITRATE (PF) 100 MCG/2ML IJ SOLN: 50.0000 ug | INTRAMUSCULAR | Status: DC | PRN

## 2020-07-21 MED ORDER — TERBUTALINE SULFATE 1 MG/ML IJ SOLN: 0.2500 mg | Freq: Once | INTRAMUSCULAR | Status: DC | PRN

## 2020-07-21 MED ORDER — MISOPROSTOL 50MCG HALF TABLET
50.0000 ug | ORAL_TABLET | ORAL | Status: DC | PRN
  Administered 2020-07-21 (×2): 50 ug via BUCCAL
  Filled 2020-07-21: qty 1

## 2020-07-21 MED ORDER — OXYTOCIN-SODIUM CHLORIDE 30-0.9 UT/500ML-% IV SOLN: 1.0000 m[IU]/min | INTRAVENOUS | Status: DC

## 2020-07-21 MED ORDER — OXYCODONE-ACETAMINOPHEN 5-325 MG PO TABS: 2.0000 | ORAL_TABLET | ORAL | Status: DC | PRN

## 2020-07-21 MED ORDER — DIPHENHYDRAMINE HCL 50 MG/ML IJ SOLN: 12.5000 mg | INTRAMUSCULAR | Status: DC | PRN

## 2020-07-21 MED ORDER — LIDOCAINE HCL (PF) 1 % IJ SOLN
INTRAMUSCULAR | Status: DC | PRN
  Administered 2020-07-21: 10 mL via EPIDURAL

## 2020-07-21 MED ORDER — SOD CITRATE-CITRIC ACID 500-334 MG/5ML PO SOLN: 30.0000 mL | ORAL | Status: DC | PRN

## 2020-07-21 MED ORDER — OXYTOCIN-SODIUM CHLORIDE 30-0.9 UT/500ML-% IV SOLN
1.0000 m[IU]/min | INTRAVENOUS | Status: DC
  Administered 2020-07-22: 2 m[IU]/min via INTRAVENOUS

## 2020-07-21 MED ORDER — LACTATED RINGERS IV SOLN: INTRAVENOUS | Status: DC

## 2020-07-21 MED ORDER — LACTATED RINGERS IV SOLN: 500.0000 mL | Freq: Once | INTRAVENOUS | Status: DC

## 2020-07-21 MED ORDER — OXYTOCIN BOLUS FROM INFUSION: 333.0000 mL | Freq: Once | INTRAVENOUS | Status: DC

## 2020-07-21 MED ORDER — MISOPROSTOL 25 MCG QUARTER TABLET
25.0000 ug | ORAL_TABLET | ORAL | Status: DC | PRN
  Administered 2020-07-21: 25 ug via VAGINAL
  Filled 2020-07-21: qty 1

## 2020-07-21 MED ORDER — OXYCODONE-ACETAMINOPHEN 5-325 MG PO TABS
1.0000 | ORAL_TABLET | ORAL | Status: DC | PRN
Start: 2020-07-21 — End: 2020-07-22

## 2020-07-21 MED ORDER — EPHEDRINE 5 MG/ML INJ: 10.0000 mg | INTRAVENOUS | Status: DC | PRN

## 2020-07-21 MED ORDER — MISOPROSTOL 50MCG HALF TABLET
ORAL_TABLET | ORAL | Status: AC
  Filled 2020-07-21: qty 1

## 2020-07-21 MED ORDER — PHENYLEPHRINE 40 MCG/ML (10ML) SYRINGE FOR IV PUSH (FOR BLOOD PRESSURE SUPPORT): 80.0000 ug | PREFILLED_SYRINGE | INTRAVENOUS | Status: DC | PRN

## 2020-07-21 MED ORDER — OXYTOCIN-SODIUM CHLORIDE 30-0.9 UT/500ML-% IV SOLN
2.5000 [IU]/h | INTRAVENOUS | Status: DC
  Filled 2020-07-21: qty 500

## 2020-07-21 MED ORDER — FENTANYL-BUPIVACAINE-NACL 0.5-0.125-0.9 MG/250ML-% EP SOLN
12.0000 mL/h | EPIDURAL | Status: DC | PRN
  Administered 2020-07-21: 12 mL/h via EPIDURAL

## 2020-07-21 MED ORDER — ZOLPIDEM TARTRATE 5 MG PO TABS: 5.0000 mg | ORAL_TABLET | Freq: Every evening | ORAL | Status: DC | PRN

## 2020-07-21 MED ORDER — HYDROXYZINE HCL 50 MG PO TABS: 50.0000 mg | ORAL_TABLET | Freq: Four times a day (QID) | ORAL | Status: DC | PRN

## 2020-07-21 MED ORDER — FENTANYL-BUPIVACAINE-NACL 0.5-0.125-0.9 MG/250ML-% EP SOLN
EPIDURAL | Status: AC
  Filled 2020-07-21: qty 250

## 2020-07-21 MED ORDER — ACETAMINOPHEN 325 MG PO TABS: 650.0000 mg | ORAL_TABLET | ORAL | Status: DC | PRN

## 2020-07-21 MED ORDER — FLEET ENEMA 7-19 GM/118ML RE ENEM: 1.0000 | ENEMA | Freq: Every day | RECTAL | Status: DC | PRN

## 2020-07-21 MED ORDER — LIDOCAINE HCL (PF) 1 % IJ SOLN: 30.0000 mL | INTRAMUSCULAR | Status: DC | PRN

## 2020-07-21 MED ORDER — ONDANSETRON HCL 4 MG/2ML IJ SOLN: 4.0000 mg | Freq: Four times a day (QID) | INTRAMUSCULAR | Status: DC | PRN

## 2020-07-21 NOTE — Anesthesia Procedure Notes (Signed)
Epidural Patient location during procedure: OB Start time: 07/21/2020 11:40 PM End time: 07/21/2020 11:43 PM  Staffing Anesthesiologist: Leilani Able, MD Performed: anesthesiologist   Preanesthetic Checklist Completed: patient identified, IV checked, site marked, risks and benefits discussed, surgical consent, monitors and equipment checked, pre-op evaluation and timeout performed  Epidural Patient position: sitting Prep: DuraPrep and site prepped and draped Patient monitoring: continuous pulse ox and blood pressure Approach: midline Location: L3-L4 Injection technique: LOR air  Needle:  Needle type: Tuohy  Needle gauge: 17 G Needle length: 9 cm and 9 Needle insertion depth: 9 cm Catheter type: closed end flexible Catheter size: 19 Gauge Catheter at skin depth: 15 cm Test dose: negative and Other  Assessment Events: blood not aspirated, injection not painful, no injection resistance, no paresthesia and negative IV test  Additional Notes Reason for block:procedure for pain

## 2020-07-21 NOTE — H&P (Addendum)
OBSTETRIC ADMISSION HISTORY AND PHYSICAL  Karen Soto is a 30 y.o. female G2P1001 with IUP at [redacted]w[redacted]d by 7 wk [place] presenting for IOL for IUGR. She reports +FMs, No LOF, no VB, no blurry vision, headaches or peripheral edema, and RUQ pain.  She plans on breast feeding. She is currently considering natural family planning for birth control, but considering other contraception options. She received her prenatal care at [facility]   Dating: By 7 wk [place] --->  Estimated Date of Delivery: 08/04/20  Sono:    @[redacted]w[redacted]d , CWD, normal anatomy, cephalic presentation, posterior fundal placenta, 2216g, 10% EFW (prior 03-21-2003 at [redacted]w[redacted]d at 3%ile)   Prenatal History/Complications:  -IUGR -Iron deficiency anemia -Alpha thalassemia trait -Rh negative (s/p Rhogam at 28 weeks) -Obesity (current BMI 38.84) -Declined Tdap/flu/COVID vaccines  Past Medical History: Past Medical History:  Diagnosis Date  . Alpha thalassemia trait 01/18/2020  . Asthma   . Chlamydia   . Drooping eyelid 01/04/2012   right  . Headache(784.0) 01/08/2012   "lots since Monday (01/04/2012)"  . Obesity     Past Surgical History: Past Surgical History:  Procedure Laterality Date  . INCISION AND DRAINAGE OF WOUND  ~ 2010   "MRSA in her back; had it drained @ the hospital"  . LAPAROSCOPIC APPENDECTOMY  01/08/2012   Procedure: APPENDECTOMY LAPAROSCOPIC;  Surgeon: 01/10/2012, MD;  Location: [facility] OR;  Service: General;  Laterality: N/A;    Obstetrical History: OB History    Gravida  2   Para  1   Term  1   Preterm  0   AB  0   Living  1     SAB  0   IAB  0   Ectopic  0   Multiple  0   Live Births  1           Social History Social History   Socioeconomic History  . Marital status: Single    Spouse name: Not on file  . Number of children: Not on file  . Years of education: Not on file  . Highest education level: Not on file  Occupational History  . Not on file  Tobacco Use  . Smoking status: Never Smoker  .  Smokeless tobacco: Never Used  Vaping Use  . Vaping Use: Never used  Substance and Sexual Activity  . Alcohol use: Not Currently    Comment: occ  . Drug use: No  . Sexual activity: Yes    Birth control/protection: Injection  Other Topics Concern  . Not on file  Social History Narrative   ** Merged History Encounter **       Social Determinants of Health   Financial Resource Strain: Not on file  Food Insecurity: Not on file  Transportation Needs: Not on file  Physical Activity: Not on file  Stress: Not on file  Social Connections: Not on file    Family History: Family History  Problem Relation Age of Onset  . Hypertension Mother   . Stroke Mother   . Cancer Father        skin  . Prostate cancer Father   . Diabetes Paternal Grandmother     Allergies: Allergies  Allergen Reactions  . Olive Tree   . Wound Dressing Adhesive     Medications Prior to Admission  Medication Sig Dispense Refill Last Dose  . aspirin (ASPIRIN 81) 81 MG EC tablet Take 1 tablet (81 mg total) by mouth daily. Swallow whole. (Patient not  taking: No sig reported) 30 tablet 12   . Prenatal Vit-Fe Fumarate-FA (PRENATAL MULTIVITAMIN) TABS tablet Take 1 tablet by mouth daily at 12 noon. (Patient not taking: No sig reported)        Review of Systems   All systems reviewed and negative except as stated in HPI  Blood pressure 133/70, pulse 68, temperature 98.9 F (37.2 C), temperature source Axillary, resp. rate 18, height 5\' 9"  (1.753 m), weight 118.5 kg, last menstrual period 10/29/2019. General appearance: alert, cooperative and no distress Lungs: normal work of breathing Heart: regular rate Abdomen: soft, non-tender; bowel sounds normal Pelvic: closed/thick/high Extremities: no sign of DVT Presentation: cephalic by bedside ultrasound Fetal monitoringBaseline: 130 bpm, Variability: Moderate, Accelerations: Reactive and Decelerations: Absent Uterine activityNone Dilation: Closed Effacement  (%): Thick Station: -3 Exam by:: Dr [email or web address] (cytotec placed)  Prenatal labs: ABO, Rh: --/--/O NEG (02/27 11-02-1982) Antibody: POS (02/27 0904) Rubella: 1.27 (08/16 1020) RPR: Non Reactive (12/16 0926)  HBsAg: Negative (08/16 1020)  HIV: Non Reactive (12/16 0926)  GBS: Negative/-- (02/09 0947)  2 hr Glucola: 68/100/76, passed Genetic screening: None Anatomy 08-06-1976: Female (pt doesn't want to know gender), EICF (resolved on repeat), otherwise normal anatomy  Prenatal Transfer Tool  Maternal Diabetes: No, has h/o GDM in prior pregnancy Genetic Screening: Declined Maternal Ultrasounds/Referrals: IUGR and Isolated EIF (echogenic intracardiac focus)  Fetal Ultrasounds or other Referrals:  Referred to Materal Fetal Medicine  Maternal Substance Abuse:  No Significant Maternal Medications:  None Significant Maternal Lab Results: Group B Strep negative and Rh negative  Results for orders placed or performed during the hospital encounter of 07/21/20 (from the past 24 hour(s))  CBC   Collection Time: 07/21/20  9:04 AM  Result Value Ref Range   WBC 7.6 4.0 - 10.5 K/uL   RBC 3.96 3.87 - 5.11 MIL/uL   Hemoglobin 10.3 (L) 12.0 - 15.0 g/dL   HCT 07/23/20 (L) 96.7 - 89.3 %   MCV 82.6 80.0 - 100.0 fL   MCH 26.0 26.0 - 34.0 pg   MCHC 31.5 30.0 - 36.0 g/dL   RDW 81.0 17.5 - 10.2 %   Platelets 230 150 - 400 K/uL   nRBC 0.0 0.0 - 0.2 %  Comprehensive metabolic panel   Collection Time: 07/21/20  9:04 AM  Result Value Ref Range   Sodium 136 135 - 145 mmol/L   Potassium 4.0 3.5 - 5.1 mmol/L   Chloride 106 98 - 111 mmol/L   CO2 18 (L) 22 - 32 mmol/L   Glucose, Bld 62 (L) 70 - 99 mg/dL   BUN <5 (L) 6 - 20 mg/dL   Creatinine, Ser 07/23/20 0.44 - 1.00 mg/dL   Calcium 8.2 (L) 8.9 - 10.3 mg/dL   Total Protein 5.6 (L) 6.5 - 8.1 g/dL   Albumin 2.6 (L) 3.5 - 5.0 g/dL   AST 30 15 - 41 U/L   ALT 11 0 - 44 U/L   Alkaline Phosphatase 157 (H) 38 - 126 U/L   Total Bilirubin 0.5 0.3 - 1.2 mg/dL   GFR, Estimated 2.77 >82  mL/min   Anion gap 12 5 - 15  Type and screen   Collection Time: 07/21/20  9:04 AM  Result Value Ref Range   ABO/RH(D) O NEG    Antibody Screen POS    Sample Expiration      07/24/2020,2359 Performed at [facility], [address], [place], [place]     Patient Active Problem List  Diagnosis Date Noted  . Intrauterine growth restriction (IUGR) affecting care of mother, third trimester, single gestation 07/21/2020  . Unwanted fertility 07/10/2020  . IUGR (intrauterine growth restriction) affecting care of mother, third trimester, fetus 1 06/11/2020  . Maternal iron deficiency anemia affecting pregnancy in third trimester, antepartum 05/11/2020  . Alpha thalassemia trait 01/18/2020  . Supervision of high risk pregnancy, antepartum, third trimester 01/08/2020  . Rh negative state in antepartum period 08/09/2015  . Obesity in pregnancy, antepartum 05/15/2015    Assessment/Plan:  Karen Soto is a 30 y.o. G2P1001 at [redacted]w[redacted]d admitted for IOL for IUGR.    #Labor: Cervix unfavorable.  Given Cytotec 25 mcg vaginally for cervical ripening. #Pain: Epidural per patient request #FWB: Cat I #ID: GBS negative #MOF: Breast #MOC: NFP, but considering other contraception options #Circ: No #IUGR: Most recent US 2216 g 10%ile at [redacted]w[redacted]d, prior US 3%ile. #Iron deficiency anemia: On iron weekly. #Alpha thalassemia trait #Rh negative: S/p Rhogam at 28 weeks.  Rhogam eval postpartum. #Obesity: Current BMI 38.84 #Declined Tdap/flu/COVID vaccines  Ronna Polio, MD 07/21/20, 1018   Midwife attestation: I have seen and examined this patient; I agree with above documentation in the resident's note.   PE: Gen: calm comfortable, NAD Resp: normal effort and rate Abd: gravid  ROS, labs, PMH reviewed  Assessment/Plan: [redacted] weeks gestation Labor: latent FWB: Cat I GBS: neg Admit to LD Cervical ripening with Cytotec Anticipate SVD  Donette Larry, CNM  07/21/2020, 11:04  AM

## 2020-07-21 NOTE — Progress Notes (Signed)
Labor Progress Note Karen Soto is a 30 y.o. G2P1001 at [redacted]w[redacted]d presented for IOL for IUGR  S:  Comfortable, not feeling ctx. Foley out 2 hrs ago.  O:  BP 130/68   Pulse 89   Temp 98.5 F (36.9 C) (Oral)   Resp 16   Ht 5\' 9"  (1.753 m)   Wt 118.5 kg   LMP 10/29/2019   BMI 38.57 kg/m  EFM: baseline 140 bpm/ mod variability/ + accels/ no decels  Toco/IUPC: irregular SVE: Dilation: 2 Effacement (%): 50 Station: -3 Presentation: Vertex Exam by:: Huma Imhoff CNM  A/P: 30 y.o. G2P1001 [redacted]w[redacted]d  1. Labor: latent 2. FWB: Cat I 3. Pain: analgesia prn  S/p foley which came out 2 hrs ago but cervix with little change. Offered pt to replace foley and continue Cytotec or just continue Cytotec. Pt elects for foley replacement with Cytotec. Pitocin when appropriate. Anticipate labor progress and SVD.  [redacted]w[redacted]d, CNM 6:56 PM

## 2020-07-21 NOTE — Progress Notes (Signed)
Karen Soto is a 30 y.o. G2P1001 at [redacted]w[redacted]d admitted for IOL for IUGR.   Subjective: Feeling some occasional contractions, but not very strong.  No concerns.  Discussed FB insertion and she is agreeable.    Objective: BP 121/68   Pulse 84   Temp 98.9 F (37.2 C) (Axillary)   Resp 17   Ht 5\' 9"  (1.753 m)   Wt 118.5 kg   LMP 10/29/2019   BMI 38.57 kg/m  No intake/output data recorded.  FHT: FHR: 140 bpm, variability: moderate, accelerations: Present, decelerations: Absent UC: Irregular  SVE:   Dilation: Fingertip Effacement (%): 20 Station: -3 Exam by:: Sirus Labrie MD  Pitocin @ _ mu/min  Labs: Lab Results  Component Value Date   WBC 7.6 07/21/2020   HGB 10.3 (L) 07/21/2020   HCT 32.7 (L) 07/21/2020   MCV 82.6 07/21/2020   PLT 230 07/21/2020    Assessment / Plan: Karen Soto is a 30 y.o. G2P1001 at [redacted]w[redacted]d admitted for IOL for IUGR.   #Labor: Cervix unfavorable.  S/p Cytotec x1 vaginally.  FB placed this check.  Give Cytotec 50 mcg buccally.   #Pain: Epidural per patient request #FWB: Cat I #ID: GBS negative #MOF: Breast #MOC: NFP, but considering other contraception options #Circ: No #IUGR: Most recent [redacted]w[redacted]d 2216 g 10%ile at [redacted]w[redacted]d, prior [redacted]w[redacted]d 3%ile. #Iron deficiency anemia: On iron weekly. #Alpha thalassemia trait #Rh negative: S/p Rhogam at 28 weeks.  Rhogam eval postpartum. #Obesity: Current BMI 38.84 #Declined Tdap/flu/COVID vaccines  Mabel Unrein [place], MD 07/21/2020, 2:26 PM

## 2020-07-21 NOTE — Anesthesia Preprocedure Evaluation (Signed)
Anesthesia Evaluation  Patient identified by MRN, date of birth, ID band Patient awake    Reviewed: Allergy & Precautions, H&P , NPO status , Patient's Chart, lab work & pertinent test results  Airway Mallampati: I  TM Distance: >3 FB Neck ROM: full    Dental no notable dental hx.    Pulmonary asthma ,    Pulmonary exam normal breath sounds clear to auscultation       Cardiovascular negative cardio ROS Normal cardiovascular exam Rhythm:regular Rate:Normal     Neuro/Psych  Headaches, negative psych ROS   GI/Hepatic negative GI ROS, Neg liver ROS,   Endo/Other  negative endocrine ROS  Renal/GU negative Renal ROS  negative genitourinary   Musculoskeletal negative musculoskeletal ROS (+)   Abdominal (+) + obese,   Peds  Hematology  (+) Blood dyscrasia, anemia ,   Anesthesia Other Findings   Reproductive/Obstetrics (+) Pregnancy                             Anesthesia Physical Anesthesia Plan  ASA: II  Anesthesia Plan: Epidural   Post-op Pain Management:    Induction:   PONV Risk Score and Plan:   Airway Management Planned:   Additional Equipment: None  Intra-op Plan:   Post-operative Plan:   Informed Consent: I have reviewed the patients History and Physical, chart, labs and discussed the procedure including the risks, benefits and alternatives for the proposed anesthesia with the patient or authorized representative who has indicated his/her understanding and acceptance.       Plan Discussed with: CRNA  Anesthesia Plan Comments:         Anesthesia Quick Evaluation

## 2020-07-22 ENCOUNTER — Encounter (HOSPITAL_COMMUNITY): Payer: Self-pay | Admitting: Obstetrics & Gynecology

## 2020-07-22 DIAGNOSIS — O365930 Maternal care for other known or suspected poor fetal growth, third trimester, not applicable or unspecified: Secondary | ICD-10-CM

## 2020-07-22 DIAGNOSIS — Z3A38 38 weeks gestation of pregnancy: Secondary | ICD-10-CM

## 2020-07-22 MED ORDER — ONDANSETRON HCL 4 MG PO TABS: 4.0000 mg | ORAL_TABLET | ORAL | Status: DC | PRN

## 2020-07-22 MED ORDER — WITCH HAZEL-GLYCERIN EX PADS: 1.0000 "application " | MEDICATED_PAD | CUTANEOUS | Status: DC | PRN

## 2020-07-22 MED ORDER — TETANUS-DIPHTH-ACELL PERTUSSIS 5-2.5-18.5 LF-MCG/0.5 IM SUSY: 0.5000 mL | PREFILLED_SYRINGE | Freq: Once | INTRAMUSCULAR | Status: DC

## 2020-07-22 MED ORDER — SIMETHICONE 80 MG PO CHEW: 80.0000 mg | CHEWABLE_TABLET | ORAL | Status: DC | PRN

## 2020-07-22 MED ORDER — BENZOCAINE-MENTHOL 20-0.5 % EX AERO: 1.0000 "application " | INHALATION_SPRAY | CUTANEOUS | Status: DC | PRN

## 2020-07-22 MED ORDER — COCONUT OIL OIL: 1.0000 "application " | TOPICAL_OIL | Status: DC | PRN

## 2020-07-22 MED ORDER — PRENATAL MULTIVITAMIN CH
1.0000 | ORAL_TABLET | Freq: Every day | ORAL | Status: DC
  Administered 2020-07-22 – 2020-07-23 (×2): 1 via ORAL
  Filled 2020-07-22 (×2): qty 1

## 2020-07-22 MED ORDER — RHO D IMMUNE GLOBULIN 1500 UNIT/2ML IJ SOSY
300.0000 ug | PREFILLED_SYRINGE | Freq: Once | INTRAMUSCULAR | Status: AC
  Administered 2020-07-22: 300 ug via INTRAVENOUS
  Filled 2020-07-22: qty 2

## 2020-07-22 MED ORDER — ONDANSETRON HCL 4 MG/2ML IJ SOLN: 4.0000 mg | INTRAMUSCULAR | Status: DC | PRN

## 2020-07-22 MED ORDER — DIPHENHYDRAMINE HCL 25 MG PO CAPS: 25.0000 mg | ORAL_CAPSULE | Freq: Four times a day (QID) | ORAL | Status: DC | PRN

## 2020-07-22 MED ORDER — ACETAMINOPHEN 325 MG PO TABS
650.0000 mg | ORAL_TABLET | Freq: Four times a day (QID) | ORAL | Status: DC
  Administered 2020-07-22 – 2020-07-23 (×5): 650 mg via ORAL
  Filled 2020-07-22 (×5): qty 2

## 2020-07-22 MED ORDER — SENNOSIDES-DOCUSATE SODIUM 8.6-50 MG PO TABS
2.0000 | ORAL_TABLET | Freq: Every day | ORAL | Status: DC
  Administered 2020-07-23: 2 via ORAL
  Filled 2020-07-22 (×2): qty 2

## 2020-07-22 MED ORDER — IBUPROFEN 600 MG PO TABS
600.0000 mg | ORAL_TABLET | Freq: Four times a day (QID) | ORAL | Status: DC
  Administered 2020-07-22 – 2020-07-23 (×5): 600 mg via ORAL
  Filled 2020-07-22 (×5): qty 1

## 2020-07-22 MED ORDER — DIBUCAINE (PERIANAL) 1 % EX OINT: 1.0000 "application " | TOPICAL_OINTMENT | CUTANEOUS | Status: DC | PRN

## 2020-07-22 MED ORDER — ZOLPIDEM TARTRATE 5 MG PO TABS: 5.0000 mg | ORAL_TABLET | Freq: Every evening | ORAL | Status: DC | PRN

## 2020-07-22 NOTE — Discharge Summary (Signed)
Postpartum Discharge Summary     Patient Name: Karen Soto DOB: 05-Mar-1991 MRN: [id number]  Date of admission: 07/21/2020 Delivery date:07/22/2020  Delivering provider: Lenna Sciara  Date of discharge: 07/23/2020  Admitting diagnosis: Intrauterine growth restriction (IUGR) affecting care of mother, third trimester, single gestation [O36.5930] Intrauterine pregnancy: [redacted]w[redacted]d    Secondary diagnosis:  Principal Problem:   Vaginal delivery Active Problems:   Obesity in pregnancy, antepartum   Rh negative state in antepartum period   Alpha thalassemia trait   Maternal iron deficiency anemia affecting pregnancy in third trimester, antepartum   IUGR (intrauterine growth restriction) affecting care of mother, third trimester, fetus 1   Intrauterine growth restriction (IUGR) affecting care of mother, third trimester, single gestation  Additional problems: as noted above   Discharge diagnosis: Term Pregnancy Delivered                                              Post partum procedures: rhogam Augmentation: Pitocin, Cytotec and IP Foley Complications: None  Hospital course: Induction of Labor With Vaginal Delivery   30y.o. yo G2P1001 at 351w1das admitted to the hospital 07/21/2020 for induction of labor.  Indication for induction: IUGR.  Patient had an uncomplicated labor course as follows: Membrane Rupture Time/Date: 5:31 AM ,07/22/2020   Delivery Method:Vaginal, Spontaneous  Episiotomy: None  Lacerations:  Periurethral  Details of delivery can be found in separate delivery note. Rhogam was administered s/p delivery. Patient had a routine postpartum course. Patient is discharged home 07/23/20.  Newborn Data: Birth date:07/22/2020  Birth time:9:31 AM  Gender:Female  Living status:Living  Apgars:8 ,9  Weight:2605 g   Magnesium Sulfate received: No BMZ received: No Rhophylac:Yes Given 05/09/2020, postpartum Rhogam evaluation MMR:N/A T-DaP:Last recorded admininstration 2016 Flu:  No Transfusion:No  Physical exam  Vitals:   07/22/20 1837 07/22/20 2250 07/23/20 0300 07/23/20 0618  BP: 117/70 120/64 112/65 105/63  Pulse: 94 80 80 74  Resp: '15 16 16   ' Temp: 98.5 F (36.9 C) 97.6 F (36.4 C) 98.4 F (36.9 C) 98.4 F (36.9 C)  TempSrc: Oral Oral Oral Oral  SpO2: 99% 99% 100% 99%  Weight:      Height:       General: alert, cooperative and no distress Lochia: appropriate Uterine Fundus: firm Incision: N/A DVT Evaluation: No evidence of DVT seen on physical exam. No cords or calf tenderness. No significant calf/ankle edema. Labs: Lab Results  Component Value Date   WBC 7.6 07/21/2020   HGB 10.3 (L) 07/21/2020   HCT 32.7 (L) 07/21/2020   MCV 82.6 07/21/2020   PLT 230 07/21/2020   CMP Latest Ref Rng & Units 07/21/2020  Glucose 70 - 99 mg/dL 62(L)  BUN 6 - 20 mg/dL <5(L)  Creatinine 0.44 - 1.00 mg/dL 0.60  Sodium 135 - 145 mmol/L 136  Potassium 3.5 - 5.1 mmol/L 4.0  Chloride 98 - 111 mmol/L 106  CO2 22 - 32 mmol/L 18(L)  Calcium 8.9 - 10.3 mg/dL 8.2(L)  Total Protein 6.5 - 8.1 g/dL 5.6(L)  Total Bilirubin 0.3 - 1.2 mg/dL 0.5  Alkaline Phos 38 - 126 U/L 157(H)  AST 15 - 41 U/L 30  ALT 0 - 44 U/L 11   Edinburgh Score: Edinburgh Postnatal Depression Scale Screening Tool 07/23/2020  I have been able to laugh and see the funny side of things. (No  Data)     After visit meds:  Allergies as of 07/23/2020      Reactions   Olive Tree Itching, Rash   Wound Dressing Adhesive Itching, Rash      Medication List    STOP taking these medications   aspirin 81 MG EC tablet Commonly known as: Aspirin 81   ferrous gluconate 324 MG tablet Commonly known as: FERGON     TAKE these medications   acetaminophen 325 MG tablet Commonly known as: Tylenol Take 2 tablets (650 mg total) by mouth every 6 (six) hours as needed for mild pain, moderate pain, fever or headache.   coconut oil Oil Apply 1 application topically as needed (nipple pain).   ferrous  sulfate 325 (65 FE) MG tablet Take 1 tablet (325 mg total) by mouth every other day.   ibuprofen 600 MG tablet Commonly known as: ADVIL Take 1 tablet (600 mg total) by mouth every 8 (eight) hours as needed for moderate pain or cramping.   prenatal multivitamin Tabs tablet Take 1 tablet by mouth daily at 12 noon.        Discharge home in stable condition Infant Feeding: routine diet Infant Disposition:home with mother Discharge instruction: per After Visit Summary and Postpartum booklet. Activity: Advance as tolerated. Pelvic rest for 6 weeks.  Diet: routine diet Future Appointments: Future Appointments  Date Time Provider [place]  08/23/2020 10:30 AM Truett Mainland, DO [facility] None   Follow up Visit: Message sent by [facility] 07/22/2020  Please schedule this patient for a Virtual postpartum visit in 4 weeks with the following provider: Any provider. Additional Postpartum F/U:Possible IUD placement  High risk pregnancy complicated by: IUGR Delivery mode:  Vaginal, Spontaneous  Anticipated Birth Control:  Undecided between NFP and IUD  Karen Soto, Gildardo Cranker, MD OB Fellow, Faculty Practice 07/23/2020 9:02 AM

## 2020-07-22 NOTE — Lactation Note (Addendum)
This note was copied from a baby's chart. Lactation Consultation Note  Patient Name: Boy Teretha Chalupa OMVEH'M Date: 07/22/2020 Reason for consult: L&D Initial assessment;Early term 37-38.6wks;Other (Comment) (LC visited mom at 50 mins , baby STS on moms chest sound asleep. per mom fast delivery. LC offered to review hand express , mom receptive and LC was able to hand express off 3 ml. family and LC RN aware to transport with mom to [facility].) Age: 64 mins , P 2     Maternal Data Has patient been taught Hand Expression?: Yes  Feeding Mother's Current Feeding Choice: Breast Milk  LATCH Score                    Lactation Tools Discussed/Used    Interventions    Discharge    Consult Status Consult Status: Follow-up Date: 07/22/20 Follow-up type: In-patient    Matilde Sprang Purl Claytor 07/22/2020, 10:37 AM

## 2020-07-22 NOTE — Lactation Note (Signed)
This note was copied from a baby's chart. Lactation Consultation Note  Patient Name: Karen Soto Date: 07/22/2020 Reason for consult: Initial assessment;Early term 37-38.6wks Age:29 hours  Initial visit to 9 hours old infant of a P2 mother with breastfeeding experience. Infant is in basinet upon arrival. [facility] student talked to mother about breastfeeding basics and offered assistance with latch. Mother agreed and attempted latch, modified cradle to right breast. Infant latched successfully for ~39min after a few attempts. Mother has some EBM in a colostrum vial ~90mL. LC spoonfed infant. Infant seems content.  [facility] student reviewed "caring for your LPTI" since infant is SGA. Reviewed with mother average size of a NB stomach. Talked about infant's hunger and fullness cues.  Discussed milk coming to volume. Reviewed newborn behavior and expectations during first days of life.   LC offered to set up a DEBP for stimulation and supplementation. Mother agreed. Discussed frequency, storage and caring for pump parts.   Plan: 1-Breastfeeding on demand, ensuring a deep, comfortable latch.  2-Offer breast 8-12 times in 24h period to establish good milk supply. 3-Undressing infant and place skin to skin when ready to breastfeed 4-Keep infant awake during breastfeeding session: massaging breast, infant's hand/shoulder/feet 5-Monitor voids and stools as signs good intake.  6-Encouraged maternal rest, hydration and food intake.  7-Contact LC as needed for feeds/support/concerns/questions   All questions answered at this time. Provided Lactation services brochure and promoted [employer] booklet information.    Maternal Data Has patient been taught Hand Expression?: Yes Does the patient have breastfeeding experience prior to this delivery?: Yes How long did the patient breastfeed?: 10 months  Feeding Mother's Current Feeding Choice: Breast Milk  LATCH Score Latch: Repeated attempts needed to  sustain latch, nipple held in mouth throughout feeding, stimulation needed to elicit sucking reflex.  Audible Swallowing: A few with stimulation  Type of Nipple: Everted at rest and after stimulation (short shafted)  Comfort (Breast/Nipple): Soft / non-tender  Hold (Positioning): Assistance needed to correctly position infant at breast and maintain latch.  LATCH Score: 7   Lactation Tools Discussed/Used Tools: Pump;Flanges Flange Size: 24 Breast pump type: Double-Electric Breast Pump Pump Education: Setup, frequency, and cleaning;Milk Storage Reason for Pumping: supplementation Pumping frequency: 8-12 times in 24h  Interventions Interventions: Breast feeding basics reviewed;Assisted with latch;Skin to skin;Breast massage;Hand express;Adjust position;Support pillows;Position options;Expressed milk;DEBP  Discharge WIC Program: No  Consult Status Consult Status: Follow-up Date: 07/23/20 Follow-up type: In-patient    Karen Soto 07/22/2020, 7:24 PM

## 2020-07-22 NOTE — Anesthesia Postprocedure Evaluation (Signed)
Anesthesia Post Note  Patient: Karen Soto  Procedure(s) Performed: AN AD HOC LABOR EPIDURAL     Patient location during evaluation: Mother Baby Anesthesia Type: Epidural Level of consciousness: awake and alert Pain management: pain level controlled Vital Signs Assessment: post-procedure vital signs reviewed and stable Respiratory status: spontaneous breathing, nonlabored ventilation and respiratory function stable Cardiovascular status: stable Postop Assessment: no headache, no backache, patient able to bend at knees, no apparent nausea or vomiting, adequate PO intake and able to ambulate Anesthetic complications: no   No complications documented.  Last Vitals:  Vitals:   07/22/20 1145 07/22/20 1350  BP: 119/73 124/79  Pulse: 89 96  Resp: 16 17  Temp: 37.1 C 37 C  SpO2: 100% 99%    Last Pain:  Vitals:   07/22/20 1350  TempSrc: Oral  PainSc: 3    Pain Goal:                   Blythe Stanford

## 2020-07-22 NOTE — Progress Notes (Signed)
Labor Progress Note Terriah Reggio is a 30 y.o. G2P1001 at [redacted]w[redacted]d presented for IOL for IUGR  S:  Pt resting comfortably with epidural in place. No concerns at this time.  O:  BP (!) 116/56   Pulse 82   Temp 98.4 F (36.9 C) (Oral)   Resp 16   Ht 5\' 9"  (1.753 m)   Wt 118.5 kg   LMP 10/29/2019   SpO2 100%   BMI 38.57 kg/m  EFM: baseline 130 bpm/ mod variability/ + accels/ no decels  Toco/IUPC: contractions q2-3 min SVE: Dilation: 4 Effacement (%): 70 Station: -2 Presentation: Vertex Exam by:: Jessamyn Watterson, MD  A/P: 30 y.o. G2P1001 [redacted]w[redacted]d presented for IOL for IUGR.  #Labor: S/p cytotec x3 and FB x2. Pitocin started at 0030. AROM for clear fluid at 0530. Will continue to up-titrate pitocin as clinically indicated. #GBS negative #Pain: epidural in place  [redacted]w[redacted]d, MD 5:33 AM

## 2020-07-22 NOTE — Addendum Note (Signed)
Addendum  created 07/22/20 1522 by Anson Peddie A, CRNA   Charge Capture section accepted    

## 2020-07-22 NOTE — Lactation Note (Signed)
This note was copied from a baby's chart. Lactation Consultation Note  Patient Name: Karen Soto [id number] Date: 07/22/2020 Reason for consult: Follow-up assessment;Mother's request Age:29 hours  LC in to follow up with latch. Mother is attempting latch cradle position to left breast. Observed sub-optimal position and shallow latch with cheek dimpling.  Assisted with positioning and latch. After a few attempts, infant latch successfully. Deep latch, suckling and swallow rhythmically and breast tissue movement. Mother has copious, easily expressed colostrum. Infant started to fall sleep at breast.  Assisted with hand expression and collected ~70mL and spoonfed infant. Infant was content and sleepy. Mother placed him skin to skin. Mother will start pumping to continue supplementing with EBM after breastfeeding.   Feeding plan:  1. Breastfeed following hunger cues.  2. Offer breast 8 - 12 times in 24h period to establish good milk supply.   3. Pump or hand-express and offer EBM. 4. Encouraged maternal rest, hydration and food intake.  5. Contact Lactation Services or local resources for support, questions or concerns.     Maternal Data Has patient been taught Hand Expression?: Yes Does the patient have breastfeeding experience prior to this delivery?: Yes How long did the patient breastfeed?: 10 months  Feeding Mother's Current Feeding Choice: Breast Milk  LATCH Score Latch: Repeated attempts needed to sustain latch, nipple held in mouth throughout feeding, stimulation needed to elicit sucking reflex.  Audible Swallowing: A few with stimulation  Type of Nipple: Everted at rest and after stimulation  Comfort (Breast/Nipple): Soft / non-tender  Hold (Positioning): Assistance needed to correctly position infant at breast and maintain latch.  LATCH Score: 7   Lactation Tools Discussed/Used Tools: Pump;Flanges Flange Size: 24 Breast pump type: Double-Electric Breast Pump Pump  Education: Setup, frequency, and cleaning;Milk Storage Reason for Pumping: stimulation and supplementation Pumping frequency: after feedings  Interventions Interventions: Breast feeding basics reviewed;Assisted with latch;Skin to skin;Breast massage;Hand express;DEBP;Expressed milk;Position options;Support pillows;Adjust position;Education  Discharge WIC Program: No  Consult Status Consult Status: Follow-up Date: 07/23/20 Follow-up type: In-patient    Karen Soto 07/22/2020, 10:33 PM

## 2020-07-23 ENCOUNTER — Ambulatory Visit: Payer: Self-pay

## 2020-07-23 LAB — RH IG WORKUP (INCLUDES ABO/RH)
ABO/RH(D): O NEG
Fetal Screen: NEGATIVE
Gestational Age(Wks): 38
Unit division: 0

## 2020-07-23 LAB — GLUCOSE, CAPILLARY: Glucose-Capillary: 69 mg/dL — ABNORMAL LOW (ref 70–99)

## 2020-07-23 MED ORDER — COCONUT OIL OIL: 1.0000 "application " | TOPICAL_OIL | 0 refills | Status: AC | PRN

## 2020-07-23 MED ORDER — IBUPROFEN 600 MG PO TABS
600.0000 mg | ORAL_TABLET | Freq: Three times a day (TID) | ORAL | 0 refills | Status: AC | PRN
Start: 2020-07-23 — End: ?

## 2020-07-23 MED ORDER — ACETAMINOPHEN 325 MG PO TABS: 650.0000 mg | ORAL_TABLET | Freq: Four times a day (QID) | ORAL | Status: AC | PRN

## 2020-07-23 MED ORDER — FERROUS SULFATE 325 (65 FE) MG PO TABS: 325.0000 mg | ORAL_TABLET | ORAL | 1 refills | Status: AC

## 2020-07-23 NOTE — Progress Notes (Signed)
Hypoglycemic Event  CBG:69  Treatment: Orange Juice  Symptoms: N/A  Possible Reasons for Event: Hadn't eaten since 9pm night before  Comments/MD notified: Orange juice given; encouraged to order breakfast    Karen Soto, Geanie Cooley

## 2020-07-23 NOTE — Lactation Note (Signed)
This note was copied from a baby's chart. Lactation Consultation Note  Patient Name: Karen Soto Date: 07/23/2020 Reason for consult: Follow-up assessment;Early term 37-38.6wks Age:29 hours  Follow up visit to 34 hours old infant with 2.88% weight loss. Infant is latched to left breast, football position upon arrival. Mother states breastfeeding is going better today. Mother reports using DEBP but unable to collect any colostrum. Infant has been been having good voids and stools, per mother.  LC observed a deep latch, breast tissue moving, audible swallows and suckling.   Feeding plan:  1. Breastfeed following hunger cues.  2. Stimulate infant awake at the breast 3. Offer breast 8 - 12 times in 24h period to establish good milk supply.   4. Pump for stimulation after feedings 5. Encouraged maternal rest, hydration and food intake.  6. Contact Lactation Services or local resources for support, questions or concerns.    All questions answered at this time.   Maternal Data Has patient been taught Hand Expression?: Yes Does the patient have breastfeeding experience prior to this delivery?: Yes  Feeding Mother's Current Feeding Choice: Breast Milk  LATCH Score Latch: Grasps breast easily, tongue down, lips flanged, rhythmical sucking.  Audible Swallowing: Spontaneous and intermittent  Type of Nipple: Everted at rest and after stimulation  Comfort (Breast/Nipple): Soft / non-tender  Hold (Positioning): No assistance needed to correctly position infant at breast.  LATCH Score: 10   Lactation Tools Discussed/Used Tools: Pump;Flanges Breast pump type: Double-Electric Breast Pump Reason for Pumping: stimulation and supplementation Pumping frequency: 8-12 times x 24h  Interventions Interventions: Breast feeding basics reviewed;Expressed milk;DEBP;Hand express;Education  Discharge    Consult Status Consult Status: Follow-up Date: 07/24/20 Follow-up type:  In-patient    Karen Soto A Karen Soto 07/23/2020, 8:29 PM

## 2020-07-23 NOTE — Discharge Instructions (Signed)

## 2020-07-24 ENCOUNTER — Ambulatory Visit: Payer: Self-pay

## 2020-07-24 NOTE — Lactation Note (Signed)
This note was copied from a baby's chart. Lactation Consultation Note  Patient Name: Karen Soto Date: 07/24/2020 Reason for consult: Follow-up assessment;Early term 37-38.6wks;Infant < 6lbs Age:29 hours  Mom sitting in bed, baby asleep on back in bassinet. Mom reports plans to breastfeed as long as baby desires. Reports current feedings last 10-10mins, states baby falls asleep at the breast at times/ uses mom as a pacifier, reports noting a blister to nipple which has since resolved, reports with a pump at home, currently searching for a pediatrician.  Baby with cues, mom latched baby to right breast football hold without assistance, wide angle, breast tissue movement and audible swallows noted, mom denies noting pain. Encouraged breast compression, ways to engage baby throughout feeding. Discussed signs of adequate milk transfer, and warning signs r/t feeding to notify peds/ LC. Reinforced cue based feedings, 8-12 feedings q24hrs, keep feedings to or less, monitor wet and stool diapers, engorgement and how to manage, avoid pacifiers, bottles and pumping x47mo unless indicated, [facility] BF brochure given with numbers for LC support.   Mom voiced understanding and with no further concerns. Left the room with baby still latched ~52min mark. BGilliam, RN, IBCLC  Maternal Data    Feeding Mother's Current Feeding Choice: Breast Milk  LATCH Score Latch: Grasps breast easily, tongue down, lips flanged, rhythmical sucking.  Audible Swallowing: Spontaneous and intermittent  Type of Nipple: Everted at rest and after stimulation  Comfort (Breast/Nipple): Filling, red/small blisters or bruises, mild/mod discomfort  Hold (Positioning): No assistance needed to correctly position infant at breast.  LATCH Score: 9   Lactation Tools Discussed/Used    Interventions Interventions: Breast feeding basics reviewed;Breast compression;Comfort gels  Discharge Discharge Education:  Engorgement and breast care;Warning signs for feeding baby  Consult Status Consult Status: Complete Date: 07/24/20    Karen Soto 07/24/2020, 8:32 AM

## 2020-08-15 ENCOUNTER — Ambulatory Visit: Payer: Medicaid Other | Admitting: Family Medicine

## 2020-08-23 ENCOUNTER — Ambulatory Visit: Payer: Medicaid Other | Admitting: Family Medicine

## 2022-01-22 IMAGING — US US MFM UA CORD DOPPLER
1 series · 14 of 27 positions shown · non-contrast
Comparison: none

[Series 1: us mfm ua cord doppler · 27 acquisitions, 14 frames shown]
[im 1/27]
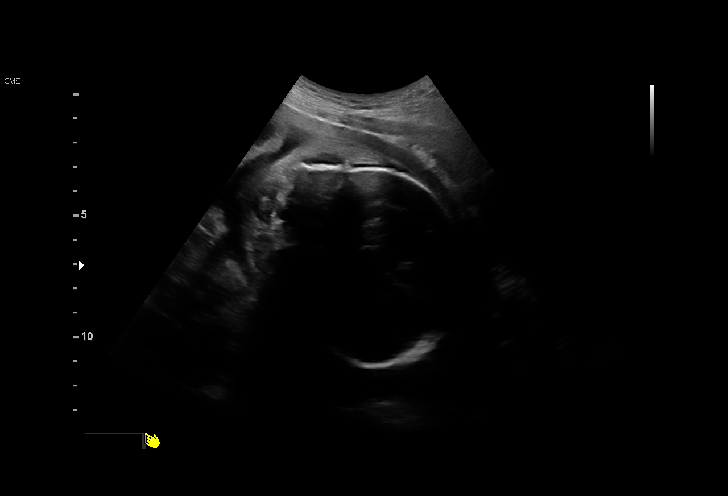
[im 3/27]
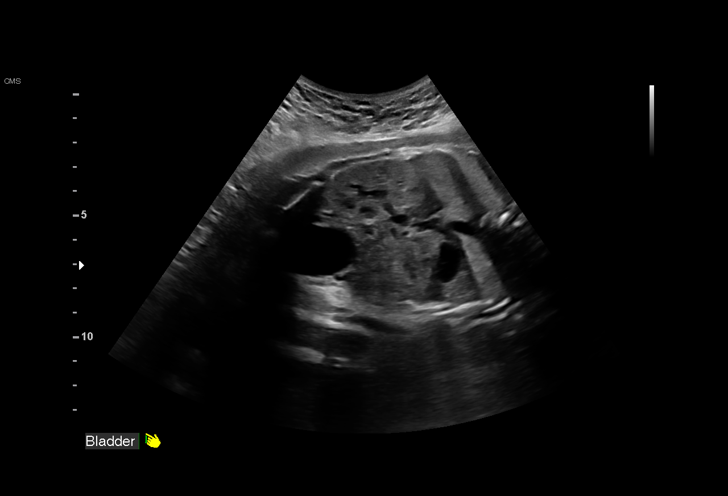
[im 5/27]
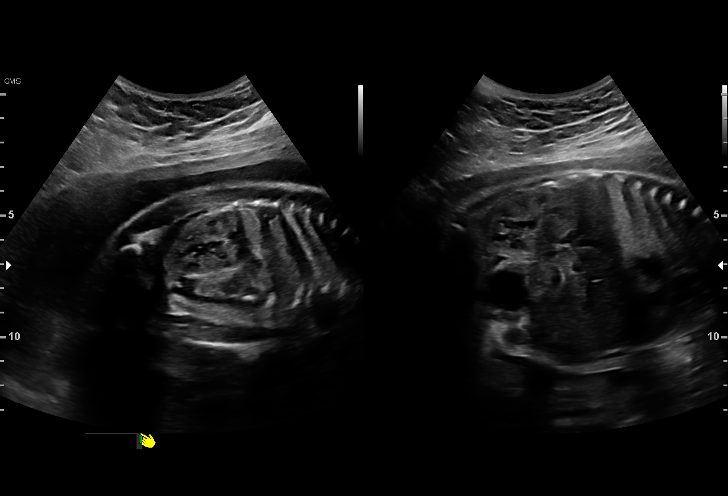
[im 7/27]
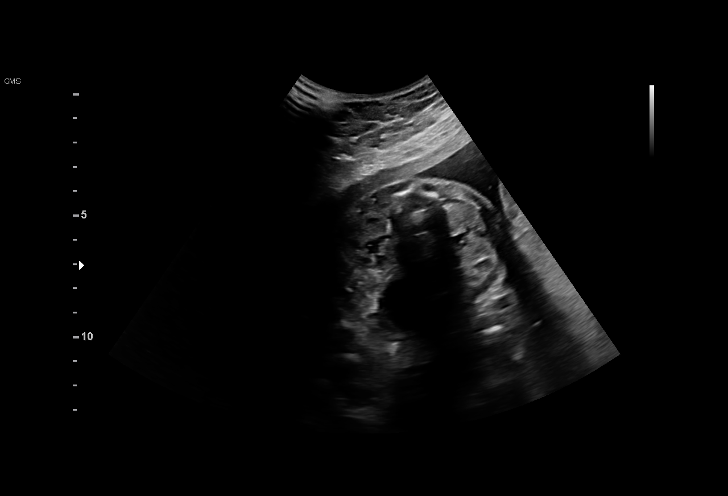
[im 9/27]
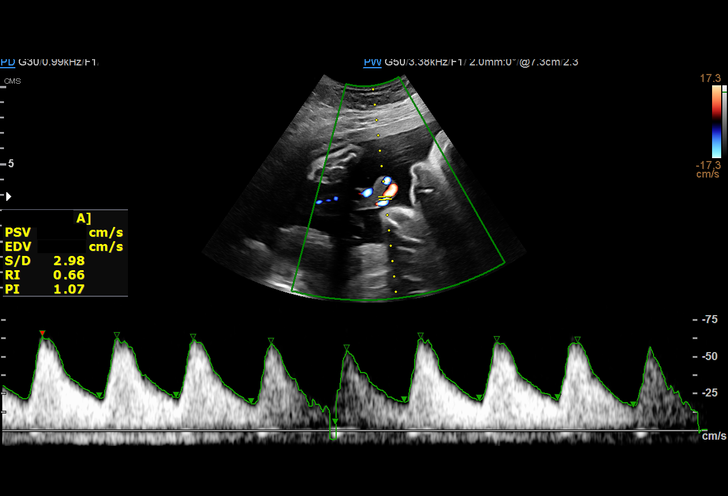
[im 11/27]
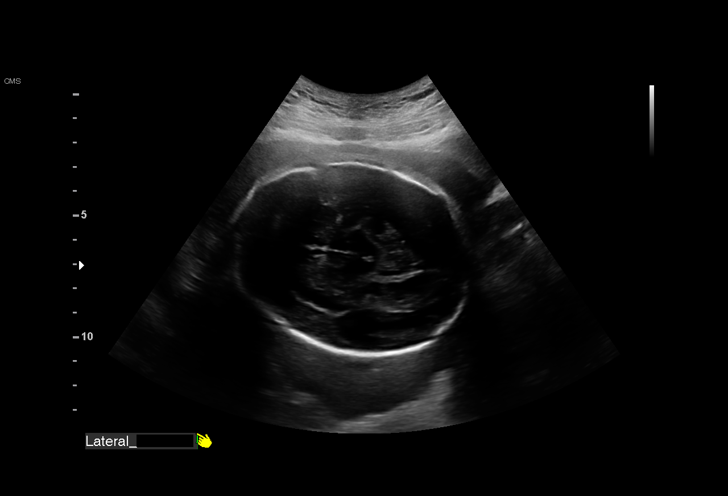
[im 13/27]
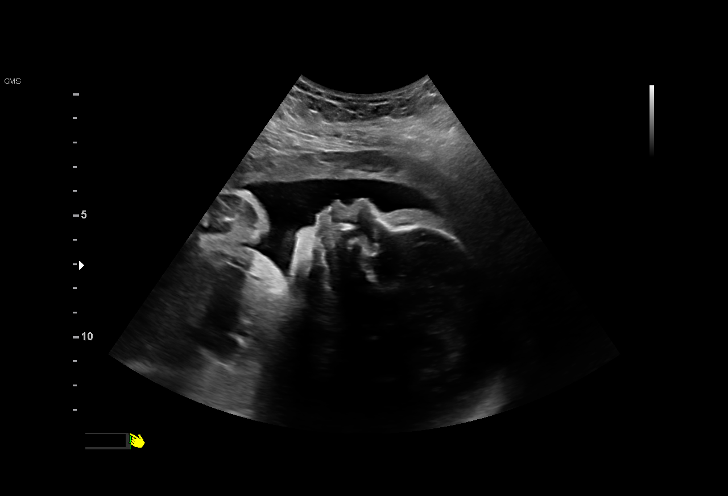
[im 15/27]
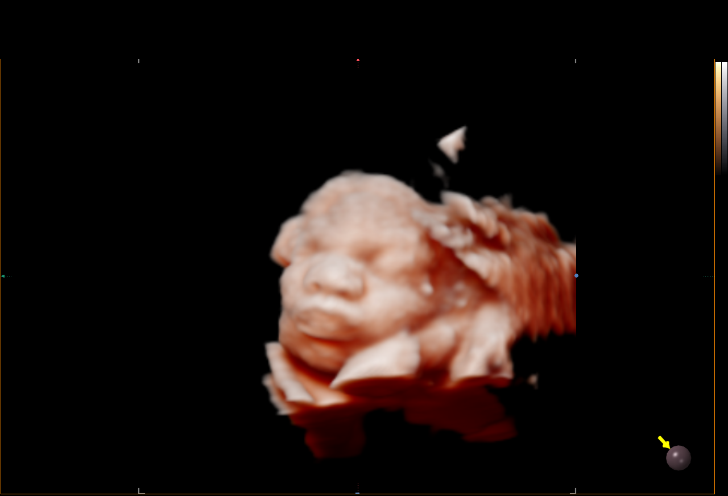
[im 17/27]
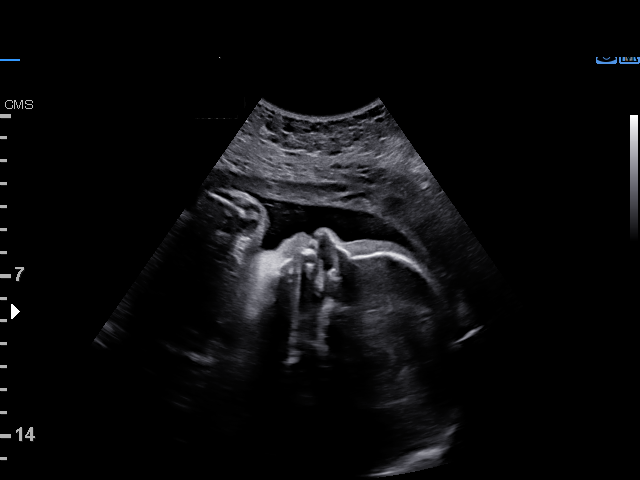
[im 19/27]
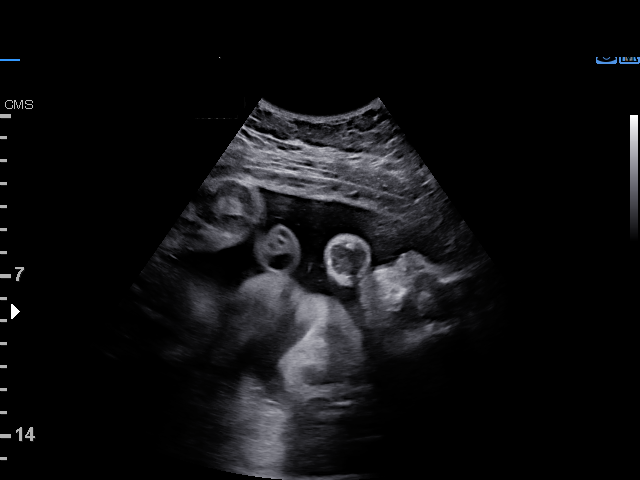
[im 21/27]
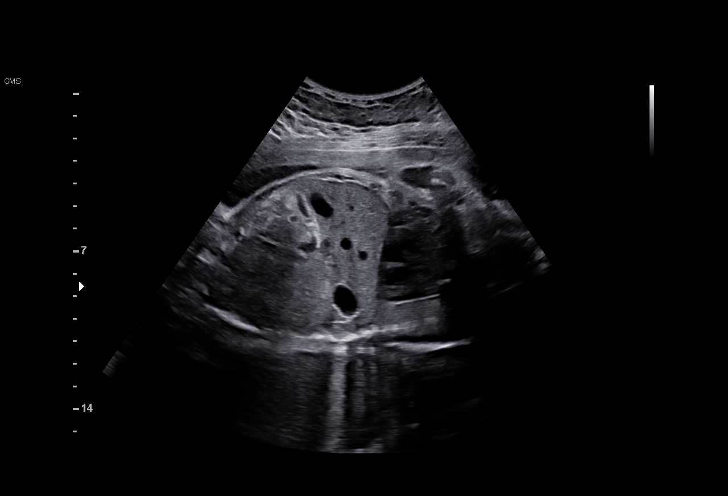
[im 23/27]
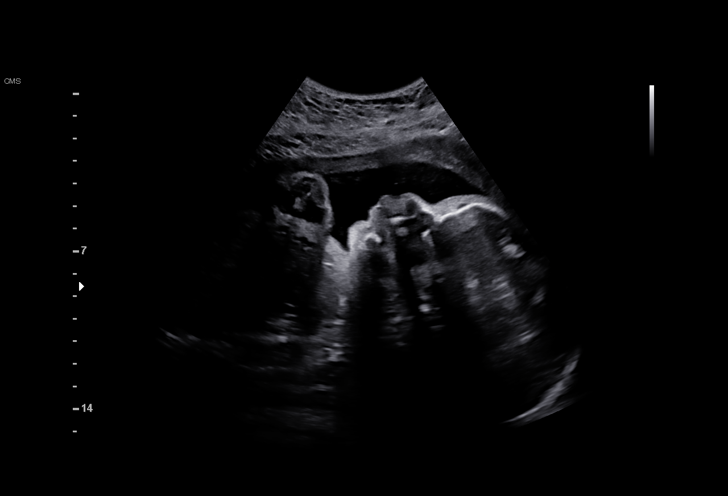
[im 25/27]
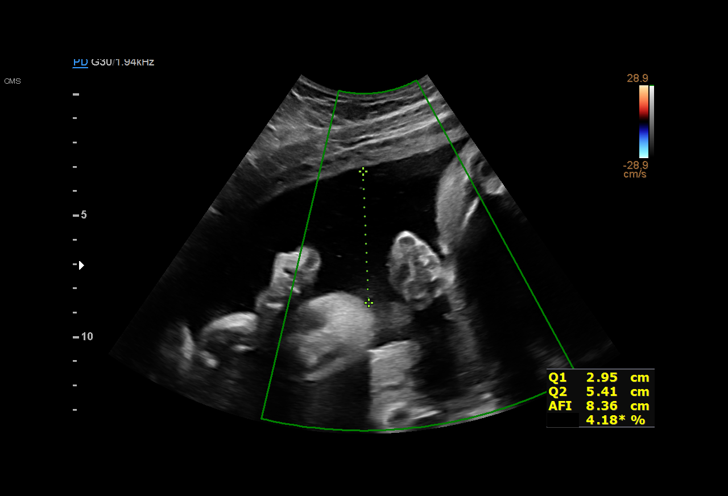
[im 27/27]
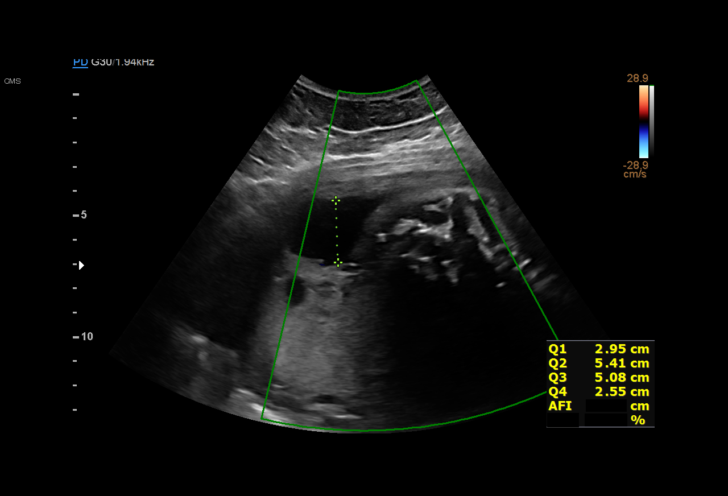

[14 of 27 positions shown; findings below may reference images not displayed]

Indications

 Maternal care for known or suspected poor
 fetal growth, third trimester, not applicable or
 unspecified IUGR
 Decreased fetal movements, third trimester,
 unspecified
 Echogenic intracardiac focus of the heart
 (EIF)
 Genetic carrier (Danii Aujla Trait)
 Poor obstetric history: Previous gestational
 diabetes
 Obesity complicating pregnancy, third
 trimester (pregravid BMI 30)
 Encounter for other antenatal screening
 follow-up
 31 weeks gestation of pregnancy
Fetal Evaluation

 Num Of Fetuses:         1
 Fetal Heart Rate(bpm):  144
 Cardiac Activity:       Observed
 Presentation:           Cephalic

 Amniotic Fluid
 AFI FV:      Within normal limits

 AFI Sum(cm)     %Tile       Largest Pocket(cm)
 16.1            58
 RUQ(cm)       RLQ(cm)       LUQ(cm)        LLQ(cm)
 3
Biophysical Evaluation

 Amniotic F.V:   Within normal limits       F. Tone:        Observed
 F. Movement:    Observed                   Score:          [DATE]
 F. Breathing:   Observed
Biometry

 LV:        1.2  mm
OB History

 Gravidity:    2         Term:   1
 Living:       1
Gestational Age

 Best:          31w 5d     Det. By:  Previous Ultrasound      EDD:   08/04/20
                                     (12/20/19)
Doppler - Fetal Vessels

 Umbilical Artery
  S/D     %tile                                              ADFV    RDFV
     3       67                                                 No      No

Cervix Uterus Adnexa

 Cervix
 Not visualized (advanced GA >22wks)

 Uterus
 No abnormality visualized.

 Cul De Sac
 No free fluid seen.

 Adnexa
 No abnormality visualized.
Impression

 Fetal growth restriction.  Patient return for antenatal testing.
 Patient does not have gestational diabetes.  Blood pressure
 today at her office is 119/63 mmHg.
 Amniotic fluid is normal and good fetal activity is seen
 .Antenatal testing is reassuring. BPP [DATE].  Umbilical artery
 Doppler showed normal forward diastolic flow.

 We reassured the patient of the findings.
Recommendations

 Fetal growth assessment next week
 Continue weekly BPP and UA Doppler studies till delivery.
                 Borigas, [facility]

## 2022-02-23 IMAGING — US US MFM FETAL BPP W/O NON-STRESS
1 series · 12 of 28 positions shown · non-contrast
Comparison: none

[Series 1: us mfm fetal bpp w/o non-stress · 30 acquisitions, 12 frames shown]
[im 2/30]
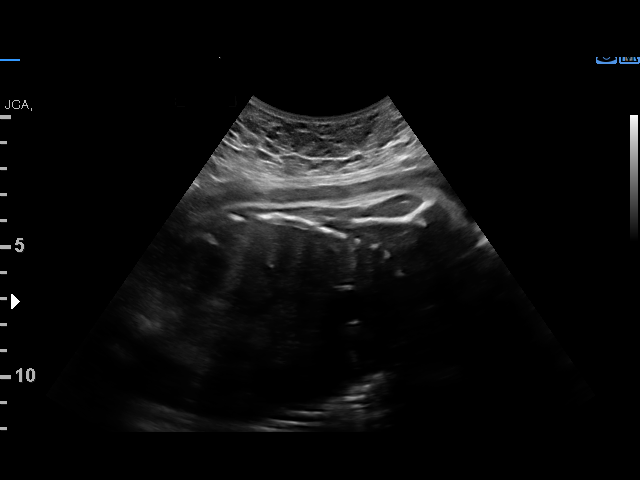
[im 4/30]
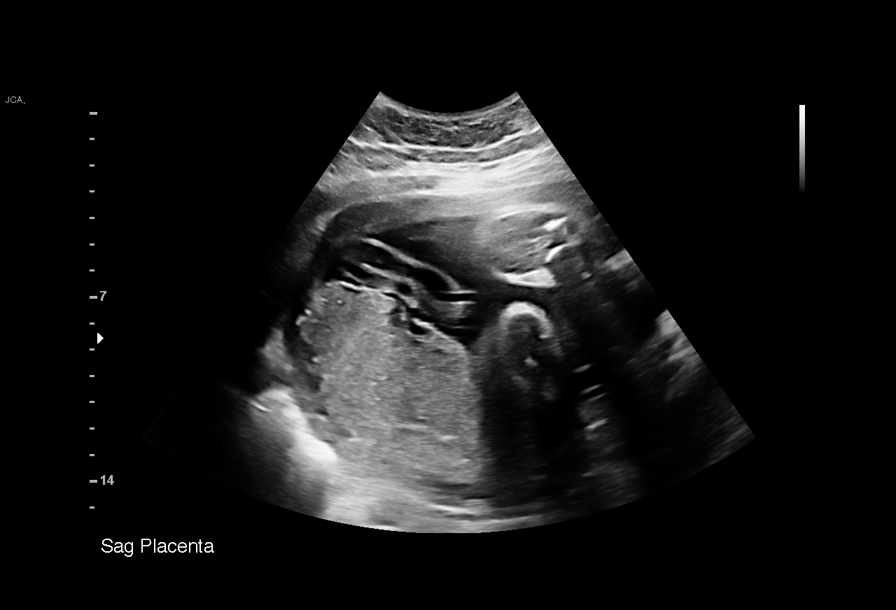
[im 6/30]
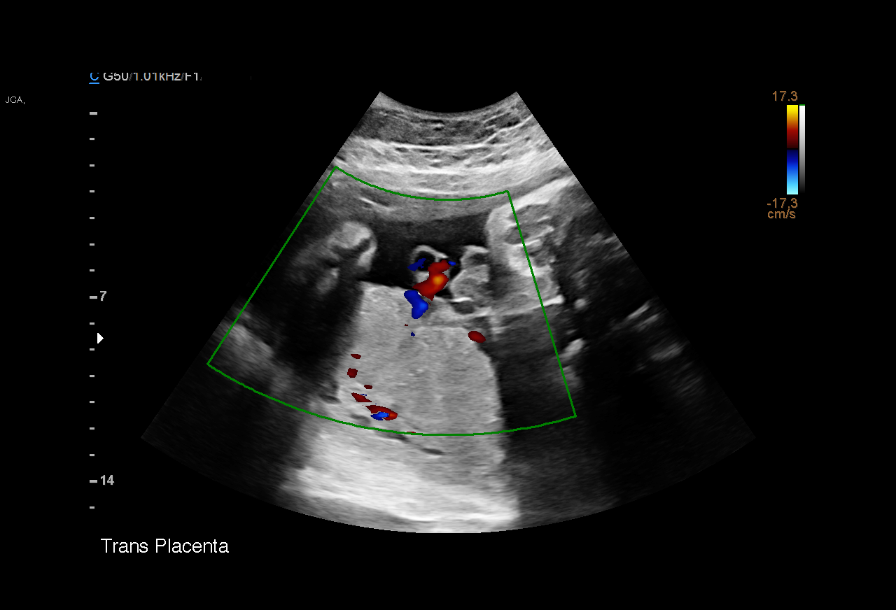
[im 9/30]
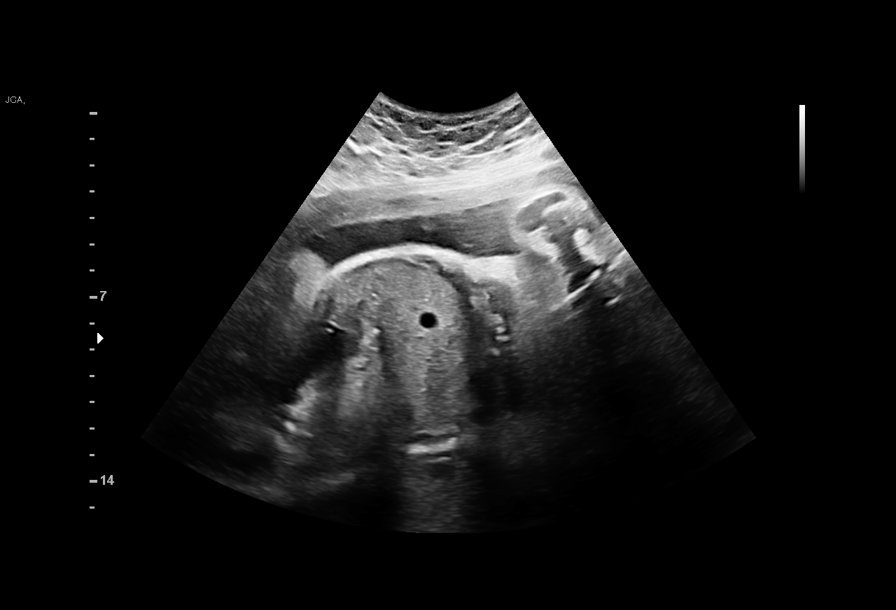
[im 11/30]
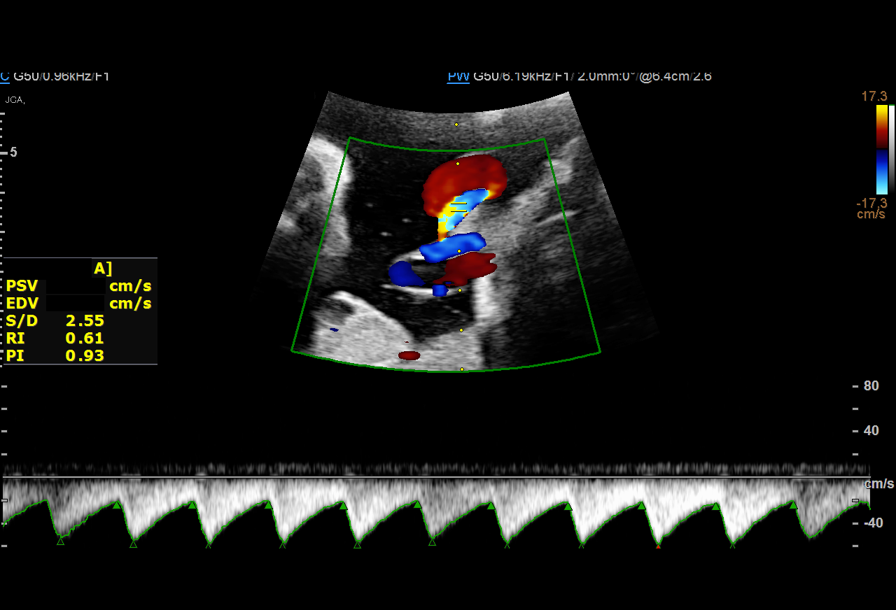
[im 13/30]
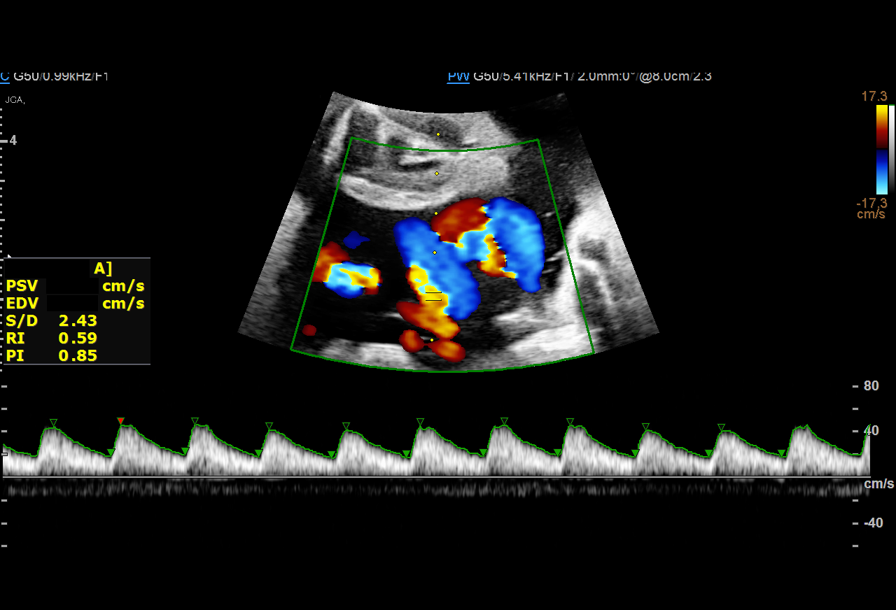
[im 17/30]
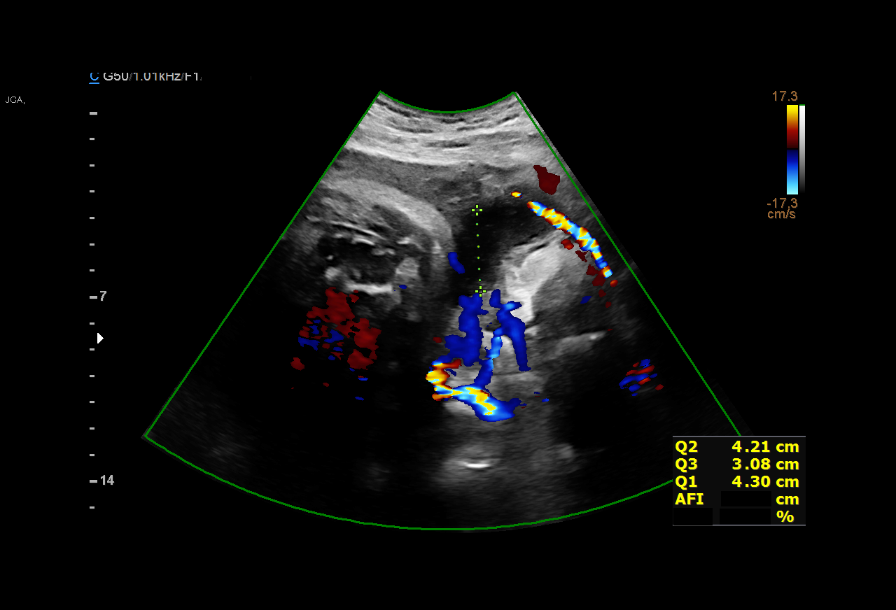
[im 19/30]
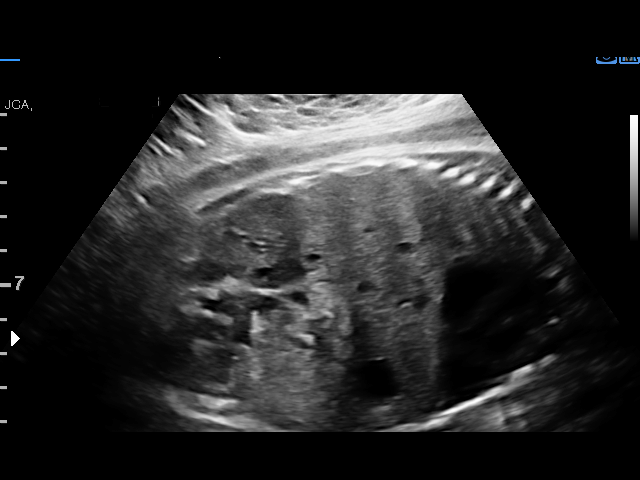
[im 21/30]
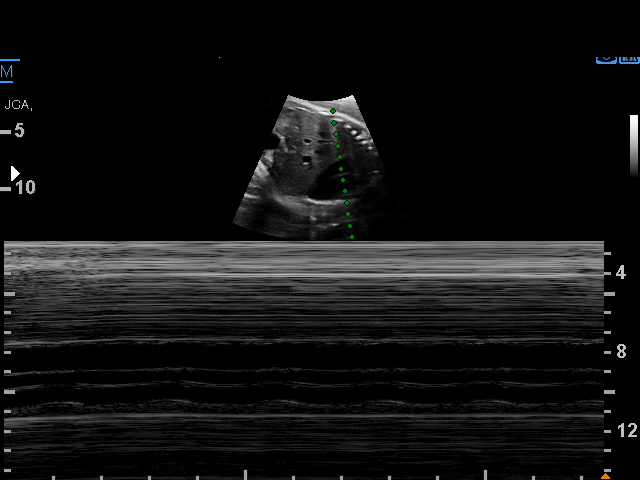
[im 24/30]
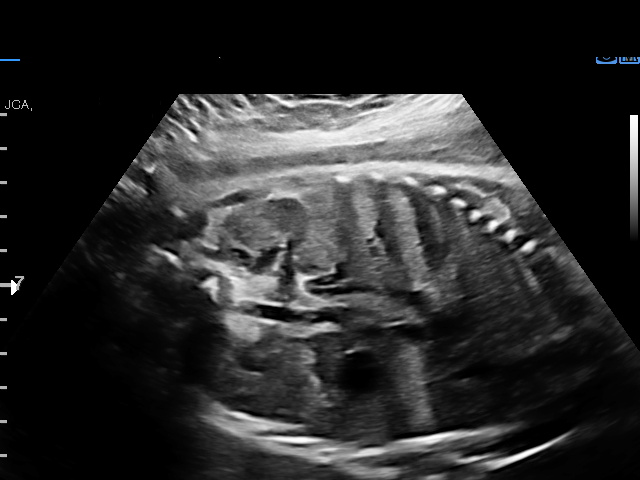
[im 26/30]
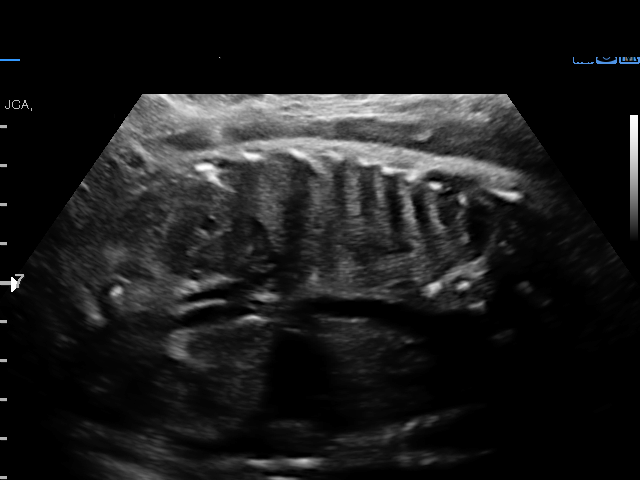
[im 28/30]
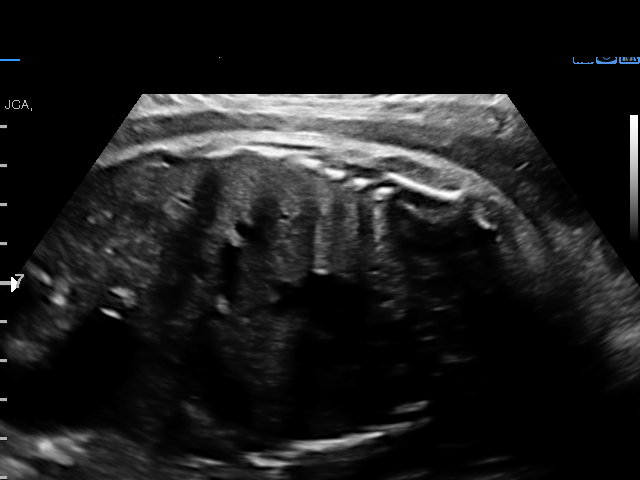

[12 of 28 positions shown; findings below may reference images not displayed]

Indications

 Marginal insertion of umbilical cord affecting
 management of mother in third trimester
 Maternal care for known or suspected poor
 fetal growth, third trimester, not applicable or
 unspecified IUGR
 Echogenic intracardiac focus of the heart
 (EIF)
 Genetic carrier (Gab Tiger Trait)
 Poor obstetric history: Previous gestational
 diabetes
 Obesity complicating pregnancy, third
 trimester (pregravid BMI 30)
 36 weeks gestation of pregnancy
Fetal Evaluation

 Num Of Fetuses:         1
 Fetal Heart Rate(bpm):  143
 Cardiac Activity:       Observed
 Presentation:           Cephalic
 Placenta:               Posterior
 P. Cord Insertion:      Marginal insertion

 Amniotic Fluid
 AFI FV:      Within normal limits

 AFI Sum(cm)     %Tile       Largest Pocket(cm)
 15.6            58
 RUQ(cm)       RLQ(cm)       LUQ(cm)        LLQ(cm)

Biophysical Evaluation

 Amniotic F.V:   Pocket => 2 cm             F. Tone:        Observed
 F. Movement:    Observed                   Score:          [DATE]
 F. Breathing:   Observed
OB History

 Gravidity:    2         Term:   1
 Living:       1
Gestational Age

 Best:          36w 2d     Det. By:  Previous Ultrasound      EDD:   08/04/20
                                     (12/20/19)
Doppler - Fetal Vessels

 Umbilical Artery
  S/D     %tile      RI    %tile      PI    %tile            ADFV    RDFV
  2.44       54    0.59       60    0.86       59               No      No

Impression

 Antenatal testing due to IUGR with an EFW 10th% with an
 AC < 24th%, prior exam the EFW was 3.2%
 Biophysical profile [DATE] with good fetal movement and
 amniotic fluid volume
 UA Dopplers are normal with no evidence of AEDF or REDF
 Marginal cord insertion persist.
Recommendations

 Continue weekly testing with UA Dopplers
 Consider delivery between 38-39 weeks.
